# Patient Record
Sex: Female | Born: 1965
Health system: Southern US, Community
[De-identification: ages and names within clinical notes are randomized; demographics above are authoritative.]

## PROBLEM LIST (undated history)

## (undated) DIAGNOSIS — K219 Gastro-esophageal reflux disease without esophagitis: Secondary | ICD-10-CM

---

## 2012-12-25 ENCOUNTER — Emergency Department (HOSPITAL_BASED_OUTPATIENT_CLINIC_OR_DEPARTMENT_OTHER): Payer: Worker's Compensation

## 2012-12-25 ENCOUNTER — Encounter (HOSPITAL_BASED_OUTPATIENT_CLINIC_OR_DEPARTMENT_OTHER): Payer: Self-pay

## 2012-12-25 ENCOUNTER — Emergency Department (HOSPITAL_BASED_OUTPATIENT_CLINIC_OR_DEPARTMENT_OTHER)
Admission: EM | Admit: 2012-12-25 | Discharge: 2012-12-25 | Disposition: A | Discharge status: Home / Self Care | Payer: Worker's Compensation | Attending: Emergency Medicine | Admitting: Emergency Medicine

## 2012-12-25 DIAGNOSIS — S60229A Contusion of unspecified hand, initial encounter: Secondary | ICD-10-CM | POA: Insufficient documentation

## 2012-12-25 DIAGNOSIS — S60221A Contusion of right hand, initial encounter: Secondary | ICD-10-CM

## 2012-12-25 DIAGNOSIS — W230XXA Caught, crushed, jammed, or pinched between moving objects, initial encounter: Secondary | ICD-10-CM | POA: Insufficient documentation

## 2012-12-25 DIAGNOSIS — Y9289 Other specified places as the place of occurrence of the external cause: Secondary | ICD-10-CM | POA: Insufficient documentation

## 2012-12-25 DIAGNOSIS — Y9389 Activity, other specified: Secondary | ICD-10-CM | POA: Insufficient documentation

## 2012-12-25 DIAGNOSIS — S60211A Contusion of right wrist, initial encounter: Secondary | ICD-10-CM

## 2012-12-25 DIAGNOSIS — S60219A Contusion of unspecified wrist, initial encounter: Secondary | ICD-10-CM | POA: Insufficient documentation

## 2012-12-25 DIAGNOSIS — Y990 Civilian activity done for income or pay: Secondary | ICD-10-CM | POA: Insufficient documentation

## 2012-12-25 MED ORDER — TRAMADOL HCL 50 MG PO TABS: 50.0000 mg | ORAL_TABLET | Freq: Four times a day (QID) | ORAL | Status: DC | PRN

## 2012-12-25 NOTE — ED Provider Notes (Addendum)
CSN: [id number]     Arrival date & time 12/25/12  1013 History     First MD Initiated Contact with Patient 12/25/12 1041     Chief Complaint  Patient presents with  . Right Wrist Injury    (Consider location/radiation/quality/duration/timing/severity/associated sxs/prior Treatment) Patient is a 47 y.o. female presenting with wrist pain. The history is provided by the patient.  Wrist Pain This is a new (was at work and her rag got caught in a conveyor belt and pulled her arm in) problem. The current episode started 3 to 5 hours ago. The problem occurs constantly. The problem has not changed since onset.Associated symptoms comments: Swelling and pain of the right wrist and hand with small cuts. The symptoms are aggravated by bending (palpation). The symptoms are relieved by rest and ice. She has tried rest and a cold compress for the symptoms. The treatment provided mild relief.    History reviewed. No pertinent past medical history. History reviewed. No pertinent past surgical history. No family history on file. History  Substance Use Topics  . Smoking status: Never Smoker   . Smokeless tobacco: Not on file  . Alcohol Use: No   OB History   Grav Para Term Preterm Abortions TAB SAB Ect Mult Living                 Review of Systems  Neurological: Negative for weakness and numbness.  All other systems reviewed and are negative.    Allergies  Review of patient's allergies indicates no known allergies.  Home Medications  No current outpatient prescriptions on file. BP 124/81  Pulse 58  Temp(Src) 98.1 F (36.7 C) (Oral)  Resp 16  Ht 5\' 5"  (1.651 m)  Wt 150 lb (68.04 kg)  BMI 24.96 kg/m2  SpO2 100%  LMP 12/07/2012 Physical Exam  Nursing note and vitals reviewed. Constitutional: She is oriented to person, place, and time. She appears well-developed and well-nourished. No distress.  HENT:  Head: Normocephalic and atraumatic.  Eyes: EOM are normal. Pupils are equal,  round, and reactive to light.  Cardiovascular: Normal rate.   Pulmonary/Chest: Effort normal.  Musculoskeletal:       Right wrist: She exhibits tenderness and swelling. She exhibits normal range of motion and no deformity.       Arms: Mild superficial abrasion of the dorsal wrist and hand.  Swelling and ecchymosis of right wrist and hand.  Normal <2sec cap refill and normal sensation  Neurological: She is alert and oriented to person, place, and time.  Psychiatric: She has a normal mood and affect. Her behavior is normal.    ED Course   Procedures (including critical care time)  Labs Reviewed - No data to display Dg Wrist Complete Right  12/25/2012   *RADIOLOGY REPORT*  Clinical Data: Pain post trauma  RIGHT WRIST - COMPLETE 3+ VIEW  Comparison: None.  Findings:  Frontal, oblique, lateral, and ulnar deviation scaphoid images were obtained.  There is no fracture or dislocation.  Joint spaces appear intact.  No erosive change.  IMPRESSION: No abnormality noted.   Original Report Authenticated By: Bretta Bang, M.D.   Dg Hand Complete Right  12/25/2012   *RADIOLOGY REPORT*  Clinical Data: Pain post trauma  RIGHT HAND - COMPLETE 3+ VIEW  Comparison: None.  Findings: Frontal, oblique, and lateral views were obtained. There is no fracture or dislocation.  There is mild osteoarthritic change in the fourth DIP joint.  No erosive change.  No radiopaque foreign body.  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  No apparent fracture or dislocation.   Original Report Authenticated By: Bretta Bang, M.D.   1. Contusion of wrist, right, initial encounter   2. Contusion of hand, right, initial encounter     MDM   Patient whose hand was caught in a conveyor belt today and presents due to swelling, tenderness of the hand and wrist.  Neurovascularly intact with 2+ radial pulse.  Plain films negative for acute injury and tetanus shot up-to-date. Patient placed in a Velcro wrist  splint and discharged home to ice and elevate  Gwyneth Sprout, MD 12/25/12 1103  Gwyneth Sprout, MD 12/25/12 1104

## 2012-12-25 NOTE — ED Notes (Signed)
Pt back from x-ray.

## 2012-12-25 NOTE — ED Notes (Signed)
Patient transported to X-ray ambulatory with tech. 

## 2012-12-25 NOTE — ED Notes (Signed)
Pt injured her right wrist, right wrist is now swollen. ROM intact, sensation intact, motor intact. Good capillary refill. Pulse present. Pain 8/10. Pt alert, oriented. Thoughts coherent. ABC intact.

## 2012-12-25 NOTE — ED Notes (Signed)
MD at bedside. 

## 2013-07-18 ENCOUNTER — Encounter: Payer: Self-pay | Admitting: Physician Assistant

## 2013-07-18 ENCOUNTER — Ambulatory Visit (INDEPENDENT_AMBULATORY_CARE_PROVIDER_SITE_OTHER): Payer: Self-pay | Admitting: Physician Assistant

## 2013-07-18 VITALS — BP 106/78 | HR 74 | Temp 98.8°F | Resp 16 | Ht 64.0 in | Wt 165.8 lb

## 2013-07-18 DIAGNOSIS — M25531 Pain in right wrist: Secondary | ICD-10-CM

## 2013-07-18 DIAGNOSIS — M25539 Pain in unspecified wrist: Secondary | ICD-10-CM

## 2013-07-18 DIAGNOSIS — G5600 Carpal tunnel syndrome, unspecified upper limb: Secondary | ICD-10-CM

## 2013-07-18 NOTE — Patient Instructions (Signed)
Please go downstairs for imaging.  I will call you with your results.  You will be contacted by a Sports Medicine doctor for further evaluation and treatment.  In the meantime, take Aleve for pain.  Apply ice to wrist.  Elevate the wrist.  Attempt to continue wearing wrist splint, even at night.  Sndrome del tnel carpiano  (Carpal Tunnel Syndrome)  El tnel carpiano es un espacio estrecho ubicado en el lado palmar de la [place]. Est formado por los huesos de la [name] y los ligamentos. Los nervios, vasos sanguneos y tendones pasan a travs del tnel carpiano. Los movimientos de la [name] o ciertas enfermedades pueden causar hinchazn del tnel. Esta hinchazn comprime el nervio principal en la mueca ((nervio mediano) y ocasiona un trastorno doloroso que se denomina sndrome del tnel carpiano. CAUSAS   Movimientos repetidos de [name] mueca.  Lesiones en la [name].  Ciertas enfermedades como la artritis, la diabetes, el alcoholismo, el hipertiroidismo o la insuficiencia renal.  Obesidad.  Embarazo. SNTOMAS   Sensacin de "pinchazos" en los dedos o la mano.  Hormigueo o entumecimiento en los dedos o en la mano.  Sensacin dolorosa en todo el brazo.  Dolor en la mueca que sube por el brazo hasta el hombro.  Dolor que baja por la mano o los dedos.  Sensacin de [place] Oildebilidad en las manos. DIAGNSTICO  El mdico le har una historia clnica y un examen fsico. Puede ser necesario hacer un electromiograma. Esta prueba mide las seales elctricas enviadas por los msculos. Generalmente el paso de las seales elctricas es impedido por el sndrome del tnel carpiano. Tambin puede ser necesario que le tomen radiografas.  TRATAMIENTO  El sndrome del tnel carpiano puede curarse espontneamente sin tratamiento. El [name] le indicar el uso de un cabestrillo para la [name] o que tome medicamentos como antiinflamatorios no esteroides. Las inyecciones de cortisona pueden ayudar. A veces es  necesaria la ciruga para liberar el nervio comprimido.  INSTRUCCIONES PARA EL CUIDADO EN EL HOGAR   Tome todos los medicamentos segn le indic su mdico. Slo tome medicamentos de venta libre o recetados para [name] el dolor, las molestias o bajar la fiebre segn las indicaciones de su mdico.  Si le aconsejaron usar un cabestrillo para evitar que la [name] se doble, selo como le indicaron. Es importante que use el cabestrillo durante la noche. selo mientras sienta dolor o adormecimiento en la mano, el brazo o la [name]. Esto puede durar entre 1 y 2 meses.  Haga reposar la [name] de toda actividad que le cause dolor. Si sus sntomas estn relacionados con [name] trabajo, deber conversar con su empleador acerca de la posibilidad de cambiar a una tarea que no requiera el uso de la [name].  Aplique hielo en la mueca despus de los perodos prolongados de Winfieldactividad.  Ponga el hielo en una bolsa plstica.  Colquese una toalla entre la piel y la bolsa de hielo.  Deje el hielo en el lugar durante 15 a 20 minutos, 3 a 4 veces por da.  Cumpla con todas las visitas de control, segn le indique su mdico. Aqu se incluyen derivaciones a un ortopedista, fisioterapia y rehabilitacin. [name] demora en obtener la asistencia necesaria puede dar como resultado una demora o fracaso en la curacin. SOLICITE ATENCIN MDICA DE INMEDIATO SI:   Desarrolla nuevos e inexplicables sntomas.  Los sntomas actuales empeoran y la medicacin no los [place]a. ASEGRESE DE QUE:   Comprende estas instrucciones.  Controlar su enfermedad.  Solicitar Jacobs Engineeringayuda  de inmediato si no mejora o si empeora. Document Released: 05/02/2005 Document Revised: 01/25/2012 [facility] Patient Information 2014 [place], [place].

## 2013-07-18 NOTE — Progress Notes (Signed)
Patient presents to clinic today to establish care.  Acute Concerns: Complains of pain of right wrist with mild decrease ROM.  Denies recent injury or heavy lifting.  Endorses injry of R wrist several months ago at work. Did not have any pain or symptoms after incident.  Patient endorses numbness and tingling of R I/II phalanges.  Has been wearing a wrist splint with some relief of symptoms.  Denies redness or warmth.  Denies hx of RA, OA or gout.  Chronic Issues: Denies significant PMH.  Health Maintenance: Dental -- overdue Vision -- overdue Immunizations -- Declines flu shot.  Tetanus Unknown.  Mammogram -- overdue.  PAP -- overdue.  History reviewed. No pertinent past medical history.  Past Surgical History  Procedure Laterality Date  . Cesarean section      No current outpatient prescriptions on file prior to visit.   No current facility-administered medications on file prior to visit.    No Known Allergies  Family History  Problem Relation Age of Onset  . Hypertension Mother     History   Social History  . Marital Status: Married    Spouse Name: N/A    Number of Children: N/A  . Years of Education: N/A   Occupational History  . Not on file.   Social History Main Topics  . Smoking status: Former Games developermoker  . Smokeless tobacco: Not on file  . Alcohol Use: Yes     Comment: rarely  . Drug Use: No  . Sexual Activity: Not on file   Other Topics Concern  . Not on file   Social History Narrative  . No narrative on file   Review of Systems  Constitutional: Negative for fever and malaise/fatigue.  HENT: Negative for ear discharge, ear pain, hearing loss and tinnitus.   Eyes: Negative for blurred vision, double vision and pain.  Respiratory: Negative for cough and shortness of breath.   Cardiovascular: Negative for chest pain and palpitations.  Gastrointestinal: Negative.   Genitourinary: Negative.   Musculoskeletal: Positive for joint pain.  Neurological:  Positive for tingling and sensory change. Negative for dizziness, speech change, focal weakness, seizures, loss of consciousness and headaches.  Endo/Heme/Allergies: Negative for environmental allergies.  Psychiatric/Behavioral: Negative for depression, suicidal ideas, hallucinations and substance abuse. The patient is not nervous/anxious and does not have insomnia.     BP 106/78  Pulse 74  Temp(Src) 98.8 F (37.1 C) (Oral)  Resp 16  Ht 5\' 4"  (1.626 m)  Wt 165 lb 12 oz (75.184 kg)  BMI 28.44 kg/m2  SpO2 98%  LMP 04/19/2013  Physical Exam  Vitals reviewed. Constitutional: She is oriented to person, place, and time and well-developed, well-nourished, and in no distress.  HENT:  Head: Normocephalic and atraumatic.  Right Ear: External ear normal.  Left Ear: External ear normal.  Eyes: Pupils are equal, round, and reactive to light.  Neck: Neck supple.  Cardiovascular: Normal rate, regular rhythm, normal heart sounds and intact distal pulses.   Pulmonary/Chest: Effort normal and breath sounds normal.  Musculoskeletal:       Right elbow: Normal.      Right wrist: She exhibits tenderness. She exhibits normal range of motion, no bony tenderness, no swelling, no effusion, no crepitus, no deformity and no laceration.  Neurological: She is alert and oriented to person, place, and time. A sensory deficit is present.  Altered tactile sensation to light touch of Right I/II phalanges. + Tinel and Phalen sign.  Skin: Skin is warm and dry.  No rash noted.  Psychiatric: Affect normal.    No results found for this or any previous visit (from the past 2160 hour(s)).  Assessment/Plan: CTS (carpal tunnel syndrome) Will obtain DG right wrist.  Wrist splints.  Alternate tylenol and Ibuprofen.  Avoid twisting motion of arms. Referral to Sports medicine for persistent CTS despite conservative measures.

## 2013-07-19 ENCOUNTER — Ambulatory Visit: Payer: Self-pay | Admitting: Family Medicine

## 2013-07-22 ENCOUNTER — Ambulatory Visit: Payer: Self-pay | Admitting: Family Medicine

## 2013-07-28 DIAGNOSIS — G5600 Carpal tunnel syndrome, unspecified upper limb: Secondary | ICD-10-CM | POA: Insufficient documentation

## 2013-07-28 NOTE — Assessment & Plan Note (Signed)
Will obtain DG right wrist.  Wrist splints.  Alternate tylenol and Ibuprofen.  Avoid twisting motion of arms. Referral to Sports medicine for persistent CTS despite conservative measures.

## 2015-06-02 ENCOUNTER — Other Ambulatory Visit: Payer: Self-pay

## 2015-06-02 ENCOUNTER — Encounter (HOSPITAL_BASED_OUTPATIENT_CLINIC_OR_DEPARTMENT_OTHER): Payer: Self-pay | Admitting: *Deleted

## 2015-06-02 ENCOUNTER — Emergency Department (HOSPITAL_BASED_OUTPATIENT_CLINIC_OR_DEPARTMENT_OTHER)
Admission: EM | Admit: 2015-06-02 | Discharge: 2015-06-02 | Disposition: A | Discharge status: Home / Self Care | Payer: 59 | Attending: Emergency Medicine | Admitting: Emergency Medicine

## 2015-06-02 ENCOUNTER — Emergency Department (HOSPITAL_BASED_OUTPATIENT_CLINIC_OR_DEPARTMENT_OTHER): Payer: 59

## 2015-06-02 DIAGNOSIS — K828 Other specified diseases of gallbladder: Secondary | ICD-10-CM | POA: Insufficient documentation

## 2015-06-02 DIAGNOSIS — K8050 Calculus of bile duct without cholangitis or cholecystitis without obstruction: Secondary | ICD-10-CM

## 2015-06-02 DIAGNOSIS — K8020 Calculus of gallbladder without cholecystitis without obstruction: Secondary | ICD-10-CM | POA: Insufficient documentation

## 2015-06-02 DIAGNOSIS — R1011 Right upper quadrant pain: Secondary | ICD-10-CM

## 2015-06-02 DIAGNOSIS — Z87891 Personal history of nicotine dependence: Secondary | ICD-10-CM | POA: Diagnosis not present

## 2015-06-02 LAB — CBC WITH DIFFERENTIAL/PLATELET
Basophils Absolute: 0 10*3/uL (ref 0.0–0.1)
Basophils Relative: 0 %
EOS ABS: 0.1 10*3/uL (ref 0.0–0.7)
EOS PCT: 1 %
HCT: 42.8 % (ref 36.0–46.0)
Hemoglobin: 14.1 g/dL (ref 12.0–15.0)
LYMPHS ABS: 3 10*3/uL (ref 0.7–4.0)
LYMPHS PCT: 39 %
MCH: 29.1 pg (ref 26.0–34.0)
MCHC: 32.9 g/dL (ref 30.0–36.0)
MCV: 88.4 fL (ref 78.0–100.0)
MONO ABS: 0.5 10*3/uL (ref 0.1–1.0)
Monocytes Relative: 6 %
Neutro Abs: 4.1 10*3/uL (ref 1.7–7.7)
Neutrophils Relative %: 54 %
PLATELETS: 336 10*3/uL (ref 150–400)
RBC: 4.84 MIL/uL (ref 3.87–5.11)
RDW: 13.5 % (ref 11.5–15.5)
WBC: 7.7 10*3/uL (ref 4.0–10.5)

## 2015-06-02 LAB — COMPREHENSIVE METABOLIC PANEL
ALT: 32 U/L (ref 14–54)
ANION GAP: 9 (ref 5–15)
AST: 22 U/L (ref 15–41)
Albumin: 4.1 g/dL (ref 3.5–5.0)
Alkaline Phosphatase: 98 U/L (ref 38–126)
BUN: 11 mg/dL (ref 6–20)
CHLORIDE: 105 mmol/L (ref 101–111)
CO2: 26 mmol/L (ref 22–32)
Calcium: 9.4 mg/dL (ref 8.9–10.3)
Creatinine, Ser: 0.7 mg/dL (ref 0.44–1.00)
Glucose, Bld: 117 mg/dL — ABNORMAL HIGH (ref 65–99)
POTASSIUM: 3.9 mmol/L (ref 3.5–5.1)
SODIUM: 140 mmol/L (ref 135–145)
Total Bilirubin: 0.4 mg/dL (ref 0.3–1.2)
Total Protein: 7.9 g/dL (ref 6.5–8.1)

## 2015-06-02 LAB — URINALYSIS, ROUTINE W REFLEX MICROSCOPIC
BILIRUBIN URINE: NEGATIVE
Glucose, UA: NEGATIVE mg/dL
Ketones, ur: NEGATIVE mg/dL
Leukocytes, UA: NEGATIVE
NITRITE: NEGATIVE
PROTEIN: NEGATIVE mg/dL
SPECIFIC GRAVITY, URINE: 1.009 (ref 1.005–1.030)
pH: 6.5 (ref 5.0–8.0)

## 2015-06-02 LAB — LIPASE, BLOOD: LIPASE: 23 U/L (ref 11–51)

## 2015-06-02 LAB — URINE MICROSCOPIC-ADD ON: WBC UA: NONE SEEN WBC/hpf (ref 0–5)

## 2015-06-02 MED ORDER — MORPHINE SULFATE (PF) 4 MG/ML IV SOLN
4.0000 mg | Freq: Once | INTRAVENOUS | Status: AC
  Administered 2015-06-02: 4 mg via INTRAVENOUS
  Filled 2015-06-02: qty 1

## 2015-06-02 MED ORDER — ONDANSETRON HCL 4 MG PO TABS: 4.0000 mg | ORAL_TABLET | Freq: Four times a day (QID) | ORAL | Status: DC

## 2015-06-02 MED ORDER — HYDROCODONE-ACETAMINOPHEN 5-325 MG PO TABS: 1.0000 | ORAL_TABLET | ORAL | Status: DC | PRN

## 2015-06-02 MED ORDER — ONDANSETRON HCL 4 MG/2ML IJ SOLN
4.0000 mg | Freq: Once | INTRAMUSCULAR | Status: AC
  Administered 2015-06-02: 4 mg via INTRAVENOUS
  Filled 2015-06-02: qty 2

## 2015-06-02 MED ORDER — SODIUM CHLORIDE 0.9 % IV SOLN
Freq: Once | INTRAVENOUS | Status: AC
  Administered 2015-06-02: 16:00:00 via INTRAVENOUS

## 2015-06-02 MED ORDER — OMEPRAZOLE 20 MG PO CPDR: 20.0000 mg | DELAYED_RELEASE_CAPSULE | Freq: Every day | ORAL | Status: DC

## 2015-06-02 NOTE — ED Provider Notes (Signed)
CSN: [id number]     Arrival date & time 06/02/15  1512 History   First MD Initiated Contact with Patient 06/02/15 1541     Chief Complaint  Patient presents with  . Abdominal Pain     (Consider location/radiation/quality/duration/timing/severity/associated sxs/prior Treatment) HPI Comments: Patient speaks only [place]. She complains of pain in her right upper abdomen that radiates to her mid back onset 3 days ago. It is constant with eating. Nausea without vomiting. Chills and subjective fever. Denies diarrhea, chest pain, shortness of breath, urinary or vaginal symptoms. She's never had this pain before. History of C-section. No heart or lung history. She has a poor appetite does not want to eat or drink due to the pain.  The history is provided by the patient. The history is limited by a language barrier. A language interpreter was used.    History reviewed. No pertinent past medical history. Past Surgical History  Procedure Laterality Date  . Cesarean section     Family History  Problem Relation Age of Onset  . Hypertension Mother    Social History  Substance Use Topics  . Smoking status: Former Games developer  . Smokeless tobacco: None  . Alcohol Use: Yes     Comment: rarely   OB History    No data available     Review of Systems  Constitutional: Positive for activity change and appetite change. Negative for fever and fatigue.  HENT: Negative for congestion and rhinorrhea.   Respiratory: Negative for cough, chest tightness and shortness of breath.   Cardiovascular: Negative for chest pain.  Gastrointestinal: Positive for nausea and abdominal pain. Negative for vomiting and diarrhea.  Genitourinary: Negative for dysuria, hematuria, vaginal bleeding and vaginal discharge.  Musculoskeletal: Negative for myalgias and arthralgias.  Skin: Negative for rash.  Neurological: Negative for dizziness, weakness and headaches.  A complete 10 system review of systems was obtained and all  systems are negative except as noted in the HPI and PMH.      Allergies  Review of patient's allergies indicates no known allergies.  Home Medications   Prior to Admission medications   Medication Sig Start Date End Date Taking? Authorizing Provider  acetaminophen (TYLENOL) 500 MG tablet Take 500 mg by mouth every 6 (six) hours as needed.    Historical Provider, MD  HYDROcodone-acetaminophen (NORCO/VICODIN) 5-325 MG tablet Take 1 tablet by mouth every 4 (four) hours as needed. 06/02/15   Glynn Octave, MD  omeprazole (PRILOSEC) 20 MG capsule Take 1 capsule (20 mg total) by mouth daily. 06/02/15   Glynn Octave, MD  ondansetron (ZOFRAN) 4 MG tablet Take 1 tablet (4 mg total) by mouth every 6 (six) hours. 06/02/15   Glynn Octave, MD   BP 123/69 mmHg  Pulse 74  Temp(Src) 98.1 F (36.7 C) (Oral)  Resp 16  Ht  (1.651 m)  Wt 164 lb (74.39 kg)  BMI 27.29 kg/m2  SpO2 100% Physical Exam  Constitutional: She is oriented to person, place, and time. She appears well-developed and well-nourished. No distress.  HENT:  Head: Normocephalic and atraumatic.  Mouth/Throat: Oropharynx is clear and moist. No oropharyngeal exudate.  Eyes: Conjunctivae and EOM are normal. Pupils are equal, round, and reactive to light.  Neck: Normal range of motion. Neck supple.  No meningismus.  Cardiovascular: Normal rate, regular rhythm, normal heart sounds and intact distal pulses.   No murmur heard. Pulmonary/Chest: Effort normal and breath sounds normal. No respiratory distress.  Abdominal: Soft. There is tenderness. There is guarding.  There is no rebound.  RUQ tenderness with guarding  Musculoskeletal: Normal range of motion. She exhibits no edema or tenderness.  No CVAT, no rash  Neurological: She is alert and oriented to person, place, and time. No cranial nerve deficit. She exhibits normal muscle tone. Coordination normal.  No ataxia on finger to nose bilaterally. No pronator drift. 5/5 strength  throughout. CN 2-12 intact.Equal grip strength. Sensation intact.   Skin: Skin is warm.  Psychiatric: She has a normal mood and affect. Her behavior is normal.  Nursing note and vitals reviewed.   ED Course  Procedures (including critical care time) Labs Review Labs Reviewed  URINALYSIS, ROUTINE W REFLEX MICROSCOPIC (NOT AT [facility]) - Abnormal; Notable for the following:    Hgb urine dipstick TRACE (*)    All other components within normal limits  URINE MICROSCOPIC-ADD ON - Abnormal; Notable for the following:    Squamous Epithelial / LPF 0-5 (*)    Bacteria, UA RARE (*)    All other components within normal limits  COMPREHENSIVE METABOLIC PANEL - Abnormal; Notable for the following:    Glucose, Bld 117 (*)    All other components within normal limits  CBC WITH DIFFERENTIAL/PLATELET  LIPASE, BLOOD    Imaging Review US Abdomen Limited Ruq  06/02/2015  CLINICAL DATA:  Right upper quadrant pain, postprandial for 4 days EXAM: US ABDOMEN LIMITED - RIGHT UPPER QUADRANT COMPARISON:  None. FINDINGS: Gallbladder: Layering sludge within the gallbladder. No visible lysed stones. No wall thickening. Negative sonographic Murphy's. Common bile duct: Diameter: Normal caliber, 1 mm. Liver: Increased echotexture throughout the liver compatible with fatty infiltration. No focal abnormality or biliary ductal dilatation. IMPRESSION: Fatty liver. Layering sludge within the gallbladder. Electronically Signed   By: Charlett Nose M.D.   On: 06/02/2015 17:34   I have personally reviewed and evaluated these images and lab results as part of my medical decision-making.   EKG Interpretation None      MDM   Final diagnoses:  RUQ pain  Sludge in gallbladder  Biliary colic  RUQ with nausea, concern for gallbladder pathology. No Chest pain or SOB. No cough. No fever.   WBC normal. LFTs and lipase normal.   ultrasound shows sludge in gallbladder without any stones. No gallbladder wall thickening or  pericholecystic fluid.  Suspect biliary colic as the source of patient's symptoms. No evidence of cholecystitis at this time. Advised to avoid alcohol, caffeine, spicy foods, NSAIDs, fatty foods. We'll treat pain and symptoms. Follow-up with surgery. Return precautions discussed.   ED ECG REPORT   Date: 06/02/2015  Rate: 67  Rhythm: normal sinus rhythm  QRS Axis: normal  Intervals: normal  ST/T Wave abnormalities: nonspecific ST/T changes  Conduction Disutrbances:none  Narrative Interpretation:   Old EKG Reviewed: none available  I have personally reviewed the EKG tracing and agree with the computerized printout as noted.   Glynn Octave, MD 06/02/15 [phone]

## 2015-06-02 NOTE — ED Notes (Signed)
Pt's daughter Tanda Rockers assisting with translating of d/c instructions. Verbalizes understanding to f/u with PCP and [facility]. Ambulatory at with steady gait. Rx x 3 given for zofran, hydrocodone, and prilosec

## 2015-06-02 NOTE — ED Notes (Signed)
Pain from the right side of her back around to her abdomen x 3 days. Nausea. Fever.

## 2015-06-02 NOTE — ED Notes (Signed)
Patient transported to Ultrasound 

## 2015-06-02 NOTE — ED Notes (Signed)
MD at bedside. 

## 2015-06-02 NOTE — Discharge Instructions (Signed)
Clico biliar Follow-up with the surgeon. Avoid alcohol, NSAIDs, spicy foods, fatty foods, caffeine. Return to the ED with worsening pain, vomiting or fever or any other concerns. (Biliary Colic) El clico biliar es un dolor en la regin superior del abdomen. El dolor:  Generalmente, se siente en la regin superior derecha del abdomen, pero tambin puede aparecer en la regin central, justo debajo del esternn.  Puede irradiarse a la espalda, hacia el omplato derecho.  Puede ser constante o discontinuo.  Puede estar acompaado de nuseas y vmitos. La mayor parte del tiempo, desaparece en el trmino de 1 a 5horas. Una vez que el dolor intenso desaparece, puede haber dolor abdominal leve que se prolonga durante aproximadamente 24horas. La causa del clico biliar es una obstruccin en las vas biliares. Las vas biliares son conductos que transportan bilis, un lquido que ayuda a Location manager las [place], desde la vescula biliar hasta el intestino delgado. Generalmente, el clico biliar ocurre despus de comer, cuando el sistema digestivo necesita bilis. El dolor aparece cuando las clulas musculares se contraen bruscamente para intentar mover la obstruccin, para que la bilis pueda pasar. INSTRUCCIONES PARA EL CUIDADO EN EL HOGAR  Tome los medicamentos solamente como se lo haya indicado el mdico.  Beba suficiente lquido para Pharmacologist la orina clara o de color amarillo plido.  Evite los alimentos grasos y fritos. Este tipo de alimentos aumentan la demanda de bilis del organismo.  Evite los alimentos que Dispensing optician.  No coma en exceso.  No ingiera una comida abundante despus de ayunar. SOLICITE ATENCIN MDICA SI:  Lance Muss.  El dolor [place].  Vomita.  Tiene nuseas que le impiden la ingesta de comidas y bebidas. SOLICITE ATENCIN MDICA DE INMEDIATO SI:  Maxie Barb repentina, tiene Grant Ruts y escalofros.  Observa una coloracin amarillenta (ictericia) de:  La  piel.  Las partes blancas de los ojos.  Las [employer].  Siente dolor continuo o intenso que no se alivia con los medicamentos.  Tiene nuseas y vmitos que no se alivian con los [employer].  Tiene mareos o se desmaya.   Esta informacin no tiene Theme park manager el consejo del mdico. Asegrese de hacerle al mdico cualquier pregunta que tenga.   Document Released: 08/09/2007 Document Revised: 09/16/2014 Elsevier Interactive Patient Education [employer]

## 2015-06-15 ENCOUNTER — Ambulatory Visit: Payer: Self-pay | Admitting: General Surgery

## 2015-06-15 NOTE — H&P (Signed)
History of Present Illness Anna Filler MD; 06/15/2015 9:30 AM) The patient is a 50 year old female who presents for evaluation of gall stones. The patient is a 50 year old Spanish-speaking female who comes in after being referred by [facility] for evaluation of biliary colic. The patient presented ER secondary to right upper quadrant pain that radiates to the right costal margin/back area. Patient states that she had associated nausea and vomiting. States that this was her first episode. She states that the pain started after eating an orange. She states that she has not had any further occurrences.  Patient's only had a history of a C-section.   Other Problems Fay Records, CMA; 06/15/2015 9:04 AM) No pertinent past medical history  Past Surgical History Fay Records, CMA; 06/15/2015 9:04 AM) Cesarean Section - 1  Diagnostic Studies History Fay Records, CMA; 06/15/2015 9:04 AM) Colonoscopy never Mammogram 1-3 years ago Pap Smear 1-5 years ago  Allergies Fay Records, CMA; 06/15/2015 9:05 AM) No Known Drug Allergies01/30/2017  Medication History Fay Records, CMA; 06/15/2015 9:05 AM) Omeprazole (  Capsule DR, Oral) Active. Ondansetron HCl (  Tablet, Oral) Active. Hydrocodone-Acetaminophen (5-325MG  Tablet, Oral) Active. Medications Reconciled  Social History Fay Records, [place]; 06/15/2015 9:04 AM) Caffeine use Tea. No alcohol use Tobacco use Never smoker.  Family History Fay Records, [place]; 06/15/2015 9:04 AM) Hypertension Mother.  Pregnancy / Birth History Fay Records, CMA; 06/15/2015 9:04 AM) Age at menarche 12 years. Age of menopause 31-50 Gravida 4 Maternal age 54-20 Para 4    Review of Systems Fay Records CMA; 06/15/2015 9:04 AM) General Present- Appetite Loss, Fatigue and Weight Gain. Not Present- Chills, Fever, Night Sweats and Weight Loss. Skin Not Present- Change in Wart/Mole, Dryness, Hives, Jaundice, New Lesions, Non-Healing Wounds,  Rash and Ulcer. HEENT Present- Sore Throat. Not Present- Earache, Hearing Loss, Hoarseness, Nose Bleed, Oral Ulcers, Ringing in the Ears, Seasonal Allergies, Sinus Pain, Visual Disturbances, Wears glasses/contact lenses and Yellow Eyes. Respiratory Present- Snoring. Not Present- Bloody sputum, Chronic Cough, Difficulty Breathing and Wheezing. Breast Not Present- Breast Mass, Breast Pain, Nipple Discharge and Skin Changes. Cardiovascular Not Present- Chest Pain, Difficulty Breathing Lying Down, Leg Cramps, Palpitations, Rapid Heart Rate, Shortness of Breath and Swelling of Extremities. Gastrointestinal Present- Abdominal Pain, Bloating, Gets full quickly at meals and Nausea. Not Present- Bloody Stool, Change in Bowel Habits, Chronic diarrhea, Constipation, Difficulty Swallowing, Excessive gas, Hemorrhoids, Indigestion, Rectal Pain and Vomiting. Female Genitourinary Not Present- Frequency, Nocturia, Painful Urination, Pelvic Pain and Urgency. Musculoskeletal Present- Back Pain. Not Present- Joint Pain, Joint Stiffness, Muscle Pain, Muscle Weakness and Swelling of Extremities. Neurological Not Present- Decreased Memory, Fainting, Headaches, Numbness, Seizures, Tingling, Tremor, Trouble walking and Weakness. Psychiatric Not Present- Anxiety, Bipolar, Change in Sleep Pattern, Depression, Fearful and Frequent crying. Endocrine Not Present- Cold Intolerance, Excessive Hunger, Hair Changes, Heat Intolerance, Hot flashes and New Diabetes. Hematology Not Present- Easy Bruising, Excessive bleeding, Gland problems, HIV and Persistent Infections.  Vitals Fay Records CMA; 06/15/2015 9:05 AM) 06/15/2015 9:05 AM Weight: 167 lb Height: 66in Body Surface Area: 1.85 m Body Mass Index: 26.95 kg/m  Temp.: 97.20F(Temporal)  Pulse: 82 (Regular)  BP: 124/72 (Sitting, Left Arm, Standard)       Physical Exam Anna Filler, MD; 06/15/2015 9:30 AM) General Mental Status-Alert. General  Appearance-Consistent with stated age. Hydration-Well hydrated. Voice-Normal.  Head and Neck Head-normocephalic, atraumatic with no lesions or palpable masses.  Eye Eyeball - Bilateral-Extraocular movements intact. Sclera/Conjunctiva - Bilateral-No scleral icterus.  Chest and Lung Exam Chest and lung exam  reveals -quiet, even and easy respiratory effort with no use of accessory muscles. Inspection Chest Wall - Normal. Back - normal.  Cardiovascular Cardiovascular examination reveals -normal heart sounds, regular rate and rhythm with no murmurs.  Abdomen Inspection Normal Exam - No Hernias. Palpation/Percussion Normal exam - Soft, Non Tender, No Rebound tenderness, No Rigidity (guarding) and No hepatosplenomegaly. Auscultation Normal exam - Bowel sounds normal.  Neurologic Neurologic evaluation reveals -alert and oriented x 3 with no impairment of recent or remote memory. Mental Status-Normal.  Musculoskeletal Normal Exam - Left-Upper Extremity Strength Normal and Lower Extremity Strength Normal. Normal Exam - Right-Upper Extremity Strength Normal, Lower Extremity Weakness.    Assessment & Plan Anna Filler MD; 06/15/2015 9:30 AM) SYMPTOMATIC CHOLELITHIASIS (K80.20) Impression: 50 year old female with likely biliary colic versus symptomatic cholelithiasis  1. The patient will like to proceed to the operative for laparoscopic cholecystectomy 2. Risks and benefits were discussed with the patient to generally include, but not limited to: infection, bleeding, possible need for post op ERCP, damage to the bile ducts, bile leak, and possible need for further surgery. Alternatives were offered and described. All questions were answered and the patient voiced understanding of the procedure and wishes to proceed at this point with a laparoscopic cholecystectomy

## 2015-07-28 ENCOUNTER — Ambulatory Visit: Admit: 2015-07-28 | Payer: Self-pay | Admitting: General Surgery

## 2015-07-28 SURGERY — LAPAROSCOPIC CHOLECYSTECTOMY
Anesthesia: General

## 2016-02-22 ENCOUNTER — Ambulatory Visit: Payer: Self-pay | Admitting: General Surgery

## 2016-02-24 ENCOUNTER — Inpatient Hospital Stay (HOSPITAL_COMMUNITY)
Admission: RE | Admit: 2016-02-24 | Discharge: 2016-02-24 | Disposition: A | Discharge status: Home / Self Care | Payer: Self-pay | Source: Ambulatory Visit

## 2016-02-24 NOTE — Pre-Procedure Instructions (Signed)
Anna GourdLeticia S Peters  02/24/2016      [id number] - [place], [place] - [address] [place] [id number] Phone: [phone] Fax: [phone]    Your procedure is scheduled on 02/29/16.  [facility] to [facility] Admitting at 830 A.M.  Call this number if you have problems the morning of surgery:  [phone]   Remember:  Do not eat food or drink liquids after midnight.  Take these medicines the morning of surgery with A SIP OF WATER omeprazole,pain med and zofran if needed  STOP all herbel meds, nsaids (aleve,naproxen,advil,ibuprofen)   Starting TODAY including aspirin, vitamins   Do not wear jewelry, make-up or nail polish.  Do not wear lotions, powders, or perfumes, or deoderant.  Do not shave 48 hours prior to surgery.  Men may shave face and neck.  Do not bring valuables to the hospital.  [facility] is not responsible for any belongings or valuables.  Contacts, dentures or bridgework may not be worn into surgery.  Leave your suitcase in the car.  After surgery it may be brought to your room.  For patients admitted to the hospital, discharge time will be determined by your treatment team.  Patients discharged the day of surgery will not be allowed to drive home.   Name and phone number of your driver:    Special instructions:   Special Instructions: [place] - Preparing for Surgery  Before surgery, you can play an important role.  Because skin is not sterile, your skin needs to be as free of germs as possible.  You can reduce the number of germs on you skin by washing with CHG (chlorahexidine gluconate) soap before surgery.  CHG is an antiseptic cleaner which kills germs and bonds with the skin to continue killing germs even after washing.  Please DO NOT use if you have an allergy to CHG or antibacterial soaps.  If your skin becomes reddened/irritated stop using the CHG and inform your nurse when  you arrive at Short Stay.  Do not shave (including legs and underarms) for at least 48 hours prior to the first CHG shower.  You may shave your face.  Please follow these instructions carefully:   1.  Shower with CHG Soap the night before surgery and the morning of Surgery.  2.  If you choose to wash your hair, wash your hair first as usual with your normal shampoo.  3.  After you shampoo, rinse your hair and body thoroughly to remove the Shampoo.  4.  Use CHG as you would any other liquid soap.  You can apply chg directly  to the skin and wash gently with scrungie or a clean washcloth.  5.  Apply the CHG Soap to your body ONLY FROM THE NECK DOWN.  Do not use on open wounds or open sores.  Avoid contact with your eyes ears, mouth and genitals (private parts).  Wash genitals (private parts)       with your normal soap.  6.  Wash thoroughly, paying special attention to the area where your surgery will be performed.  7.  Thoroughly rinse your body with warm water from the neck down.  8.  DO NOT shower/wash with your normal soap after using and rinsing off the CHG Soap.  9.  Pat yourself dry with a clean towel.            10.  Wear clean  pajamas.            11.  Place clean sheets on your bed the night of your first shower and do not sleep with pets.  Day of Surgery  Do not apply any lotions/deodorants the morning of surgery.  Please wear clean clothes to the hospital/surgery center.  Please read over the fact sheets that you were given.

## 2016-02-26 ENCOUNTER — Encounter (HOSPITAL_COMMUNITY): Payer: Self-pay | Admitting: *Deleted

## 2016-02-28 NOTE — H&P (Signed)
History of Present Illness  The patient is a 50 year old female who presents for evaluation of gall stones. The patient is a 50 year old Spanish-speaking female who comes in after being [facility] for [facility] of biliary colic. The patient presented ER secondary to right upper quadrant pain that radiates to the right costal margin/back area. Patient states that she had associated nausea and vomiting. States that this was her first episode. She states that the pain started after eating an orange. She states that she has not had any further occurrences.  Patient's only had a history of a C-section.   Other Problems No pertinent past medical history  Past Surgical History Cesarean Section - 1  Diagnostic Studies History Colonoscopy never Mammogram 1-3 years ago Pap Smear 1-5 years ago  Allergies ( No Known Drug Allergies01/30/2017  Medication History Omeprazole (20MG  Capsule DR, Oral) Active. Ondansetron HCl (4MG  Tablet, Oral) Active. Hydrocodone-Acetaminophen (5-325MG  Tablet, Oral) Active. Medications Reconciled  Social History  Caffeine use Tea. No alcohol use Tobacco use Never smoker.  Family History  Hypertension Mother.  Pregnancy / Birth History  Age at menarche 12 years. Age of menopause 896-50 Gravida 4 Maternal age 51-20 Para 4    Review of Systems  General Present- Appetite Loss, Fatigue and Weight Gain. Not Present- Chills, Fever, Night Sweats and Weight Loss. Skin Not Present- Change in Wart/Mole, Dryness, Hives, Jaundice, New Lesions, Non-Healing Wounds, Rash and Ulcer. HEENT Present- Sore Throat. Not Present- Earache, Hearing Loss, Hoarseness, Nose Bleed, Oral Ulcers, Ringing in the Ears, Seasonal Allergies, Sinus Pain, Visual Disturbances, Wears glasses/contact lenses and Yellow Eyes. Respiratory Present- Snoring. Not Present- Bloody sputum, Chronic Cough, Difficulty Breathing and Wheezing. Breast Not  Present- Breast Mass, Breast Pain, Nipple Discharge and Skin Changes. Cardiovascular Not Present- Chest Pain, Difficulty Breathing Lying Down, Leg Cramps, Palpitations, Rapid Heart Rate, Shortness of Breath and Swelling of Extremities. Gastrointestinal Present- Abdominal Pain, Bloating, Gets full quickly at meals and Nausea. Not Present- Bloody Stool, Change in Bowel Habits, Chronic diarrhea, Constipation, Difficulty Swallowing, Excessive gas, Hemorrhoids, Indigestion, Rectal Pain and Vomiting. Female Genitourinary Not Present- Frequency, Nocturia, Painful Urination, Pelvic Pain and Urgency. Musculoskeletal Present- Back Pain. Not Present- Joint Pain, Joint Stiffness, Muscle Pain, Muscle Weakness and Swelling of Extremities. Neurological Not Present- Decreased Memory, Fainting, Headaches, Numbness, Seizures, Tingling, Tremor, Trouble walking and Weakness. Psychiatric Not Present- Anxiety, Bipolar, Change in Sleep Pattern, Depression, Fearful and Frequent crying. Endocrine Not Present- Cold Intolerance, Excessive Hunger, Hair Changes, Heat Intolerance, Hot flashes and New Diabetes. Hematology Not Present- Easy Bruising, Excessive bleeding, Gland problems, HIV and Persistent Infections.   Physical Exam  General Mental Status-Alert. General Appearance-Consistent with stated age. Hydration-Well hydrated. Voice-Normal.  Head and Neck Head-normocephalic, atraumatic with no lesions or palpable masses.  Eye Eyeball - Bilateral-Extraocular movements intact. Sclera/Conjunctiva - Bilateral-No scleral icterus.  Chest and Lung Exam Chest and lung exam reveals -quiet, even and easy respiratory effort with no use of accessory muscles. Inspection Chest Wall - Normal. Back - normal.  Cardiovascular Cardiovascular examination reveals -normal heart sounds, regular rate and rhythm with no murmurs.  Abdomen Inspection Normal Exam - No Hernias. Palpation/Percussion Normal exam  - Soft, Non Tender, No Rebound tenderness, No Rigidity (guarding) and No hepatosplenomegaly. Auscultation Normal exam - Bowel sounds normal.  Neurologic Neurologic evaluation reveals -alert and oriented x 3 with no impairment of recent or remote memory. Mental Status-Normal.  Musculoskeletal Normal Exam - Left-Upper Extremity Strength Normal and Lower Extremity Strength Normal. Normal Exam -  Right-Upper Extremity Strength Normal, Lower Extremity Weakness.    Assessment & Plan SYMPTOMATIC CHOLELITHIASIS (K80.20) Impression: 50 year old female with likely biliary colic versus symptomatic cholelithiasis  1. The patient will like to proceed to the operative for laparoscopic cholecystectomy 2. Risks and benefits were discussed with the patient to generally include, but not limited to: infection, bleeding, possible need for post op ERCP, damage to the bile ducts, bile leak, and possible need for further surgery. Alternatives were offered and described. All questions were answered and the patient voiced understanding of the procedure and wishes to proceed at this point with a laparoscopic cholecystectomy

## 2016-02-29 ENCOUNTER — Ambulatory Visit (HOSPITAL_COMMUNITY)
Admission: RE | Admit: 2016-02-29 | Discharge: 2016-02-29 | Disposition: A | Discharge status: Home / Self Care | Payer: BLUE CROSS/BLUE SHIELD | Source: Ambulatory Visit | Attending: General Surgery | Admitting: General Surgery

## 2016-02-29 ENCOUNTER — Ambulatory Visit (HOSPITAL_COMMUNITY): Payer: BLUE CROSS/BLUE SHIELD | Admitting: Anesthesiology

## 2016-02-29 ENCOUNTER — Encounter (HOSPITAL_COMMUNITY)
Admission: RE | Disposition: A | Discharge status: Home / Self Care | Payer: Self-pay | Source: Ambulatory Visit | Attending: General Surgery

## 2016-02-29 ENCOUNTER — Encounter (HOSPITAL_COMMUNITY): Payer: Self-pay | Admitting: *Deleted

## 2016-02-29 DIAGNOSIS — Z87891 Personal history of nicotine dependence: Secondary | ICD-10-CM | POA: Insufficient documentation

## 2016-02-29 DIAGNOSIS — K811 Chronic cholecystitis: Secondary | ICD-10-CM | POA: Diagnosis not present

## 2016-02-29 DIAGNOSIS — K219 Gastro-esophageal reflux disease without esophagitis: Secondary | ICD-10-CM | POA: Diagnosis not present

## 2016-02-29 DIAGNOSIS — K8050 Calculus of bile duct without cholangitis or cholecystitis without obstruction: Secondary | ICD-10-CM | POA: Diagnosis present

## 2016-02-29 HISTORY — PX: CHOLECYSTECTOMY: SHX55

## 2016-02-29 HISTORY — DX: Gastro-esophageal reflux disease without esophagitis: K21.9

## 2016-02-29 LAB — CBC
HEMATOCRIT: 41.8 % (ref 36.0–46.0)
HEMOGLOBIN: 13.4 g/dL (ref 12.0–15.0)
MCH: 28.5 pg (ref 26.0–34.0)
MCHC: 32.1 g/dL (ref 30.0–36.0)
MCV: 88.7 fL (ref 78.0–100.0)
Platelets: 291 10*3/uL (ref 150–400)
RBC: 4.71 MIL/uL (ref 3.87–5.11)
RDW: 13.4 % (ref 11.5–15.5)
WBC: 5.9 10*3/uL (ref 4.0–10.5)

## 2016-02-29 SURGERY — LAPAROSCOPIC CHOLECYSTECTOMY
Anesthesia: General | Site: Abdomen

## 2016-02-29 MED ORDER — 0.9 % SODIUM CHLORIDE (POUR BTL) OPTIME
TOPICAL | Status: DC | PRN
  Administered 2016-02-29: 1000 mL

## 2016-02-29 MED ORDER — KETOROLAC TROMETHAMINE 30 MG/ML IJ SOLN
INTRAMUSCULAR | Status: DC
Start: 2016-02-29 — End: 2016-02-29
  Filled 2016-02-29: qty 1

## 2016-02-29 MED ORDER — HYDROCODONE-ACETAMINOPHEN 7.5-325 MG PO TABS: 1.0000 | ORAL_TABLET | Freq: Once | ORAL | Status: DC | PRN

## 2016-02-29 MED ORDER — MORPHINE SULFATE (PF) 2 MG/ML IV SOLN: 2.0000 mg | INTRAVENOUS | Status: DC | PRN

## 2016-02-29 MED ORDER — ONDANSETRON HCL 4 MG/2ML IJ SOLN
INTRAMUSCULAR | Status: DC | PRN
Start: 2016-02-29 — End: 2016-02-29
  Administered 2016-02-29: 4 mg via INTRAVENOUS

## 2016-02-29 MED ORDER — ACETAMINOPHEN 325 MG PO TABS: 650.0000 mg | ORAL_TABLET | ORAL | Status: DC | PRN

## 2016-02-29 MED ORDER — NEOSTIGMINE METHYLSULFATE 10 MG/10ML IV SOLN
INTRAVENOUS | Status: DC | PRN
  Administered 2016-02-29: 5 mg via INTRAVENOUS

## 2016-02-29 MED ORDER — PROMETHAZINE HCL 25 MG/ML IJ SOLN: 6.2500 mg | INTRAMUSCULAR | Status: DC | PRN

## 2016-02-29 MED ORDER — MIDAZOLAM HCL 5 MG/5ML IJ SOLN
INTRAMUSCULAR | Status: DC | PRN
  Administered 2016-02-29: 2 mg via INTRAVENOUS

## 2016-02-29 MED ORDER — DEXMEDETOMIDINE HCL IN NACL 200 MCG/50ML IV SOLN
INTRAVENOUS | Status: AC
  Filled 2016-02-29: qty 50

## 2016-02-29 MED ORDER — PROPOFOL 10 MG/ML IV BOLUS
INTRAVENOUS | Status: AC
  Filled 2016-02-29: qty 20

## 2016-02-29 MED ORDER — MIDAZOLAM HCL 2 MG/2ML IJ SOLN
INTRAMUSCULAR | Status: AC
  Filled 2016-02-29: qty 2

## 2016-02-29 MED ORDER — PROPOFOL 10 MG/ML IV BOLUS
INTRAVENOUS | Status: DC | PRN
  Administered 2016-02-29: 150 mg via INTRAVENOUS

## 2016-02-29 MED ORDER — OXYCODONE-ACETAMINOPHEN 5-325 MG PO TABS: 1.0000 | ORAL_TABLET | ORAL | 0 refills | Status: AC | PRN

## 2016-02-29 MED ORDER — SODIUM CHLORIDE 0.9% FLUSH: 3.0000 mL | INTRAVENOUS | Status: DC | PRN

## 2016-02-29 MED ORDER — CEFAZOLIN SODIUM-DEXTROSE 2-4 GM/100ML-% IV SOLN
2.0000 g | INTRAVENOUS | Status: AC
  Administered 2016-02-29: 2 g via INTRAVENOUS
  Filled 2016-02-29: qty 100

## 2016-02-29 MED ORDER — OXYCODONE HCL 5 MG PO TABS: 5.0000 mg | ORAL_TABLET | ORAL | Status: DC | PRN

## 2016-02-29 MED ORDER — HYDROMORPHONE HCL 1 MG/ML IJ SOLN
INTRAMUSCULAR | Status: AC
  Filled 2016-02-29: qty 1

## 2016-02-29 MED ORDER — GLYCOPYRROLATE 0.2 MG/ML IV SOSY
PREFILLED_SYRINGE | INTRAVENOUS | Status: AC
  Filled 2016-02-29: qty 3

## 2016-02-29 MED ORDER — CHLORHEXIDINE GLUCONATE CLOTH 2 % EX PADS
6.0000 | MEDICATED_PAD | Freq: Once | CUTANEOUS | Status: DC
Start: 2016-02-29 — End: 2016-02-29

## 2016-02-29 MED ORDER — ACETAMINOPHEN 650 MG RE SUPP: 650.0000 mg | RECTAL | Status: DC | PRN

## 2016-02-29 MED ORDER — NEOSTIGMINE METHYLSULFATE 5 MG/5ML IV SOSY
PREFILLED_SYRINGE | INTRAVENOUS | Status: AC
Start: 2016-02-29 — End: 2016-02-29
  Filled 2016-02-29: qty 5

## 2016-02-29 MED ORDER — LIDOCAINE 2% (20 MG/ML) 5 ML SYRINGE
INTRAMUSCULAR | Status: AC
Start: 2016-02-29 — End: 2016-02-29
  Filled 2016-02-29: qty 5

## 2016-02-29 MED ORDER — FENTANYL CITRATE (PF) 100 MCG/2ML IJ SOLN
INTRAMUSCULAR | Status: AC
  Filled 2016-02-29: qty 4

## 2016-02-29 MED ORDER — GLYCOPYRROLATE 0.2 MG/ML IJ SOLN
INTRAMUSCULAR | Status: DC | PRN
  Administered 2016-02-29: .6 mg via INTRAVENOUS

## 2016-02-29 MED ORDER — LACTATED RINGERS IV SOLN
INTRAVENOUS | Status: DC
  Administered 2016-02-29: 10:00:00 via INTRAVENOUS

## 2016-02-29 MED ORDER — HYDROMORPHONE HCL 1 MG/ML IJ SOLN
0.2500 mg | INTRAMUSCULAR | Status: DC | PRN
  Administered 2016-02-29 (×4): 0.5 mg via INTRAVENOUS

## 2016-02-29 MED ORDER — LIDOCAINE HCL (CARDIAC) 20 MG/ML IV SOLN
INTRAVENOUS | Status: DC | PRN
  Administered 2016-02-29: 60 mg via INTRATRACHEAL

## 2016-02-29 MED ORDER — CHLORHEXIDINE GLUCONATE CLOTH 2 % EX PADS: 6.0000 | MEDICATED_PAD | Freq: Once | CUTANEOUS | Status: DC

## 2016-02-29 MED ORDER — ROCURONIUM BROMIDE 100 MG/10ML IV SOLN
INTRAVENOUS | Status: DC | PRN
  Administered 2016-02-29: 40 mg via INTRAVENOUS
  Administered 2016-02-29: 10 mg via INTRAVENOUS

## 2016-02-29 MED ORDER — SODIUM CHLORIDE 0.9 % IV SOLN: 250.0000 mL | INTRAVENOUS | Status: DC | PRN

## 2016-02-29 MED ORDER — ARTIFICIAL TEARS OP OINT
TOPICAL_OINTMENT | OPHTHALMIC | Status: AC
  Filled 2016-02-29: qty 3.5

## 2016-02-29 MED ORDER — BUPIVACAINE HCL 0.25 % IJ SOLN
INTRAMUSCULAR | Status: DC | PRN
  Administered 2016-02-29: 7 mL

## 2016-02-29 MED ORDER — SODIUM CHLORIDE 0.9 % IR SOLN
Status: DC | PRN
  Administered 2016-02-29: 1

## 2016-02-29 MED ORDER — BUPIVACAINE HCL (PF) 0.25 % IJ SOLN
INTRAMUSCULAR | Status: AC
  Filled 2016-02-29: qty 10

## 2016-02-29 MED ORDER — FENTANYL CITRATE (PF) 100 MCG/2ML IJ SOLN
INTRAMUSCULAR | Status: AC
  Filled 2016-02-29: qty 2

## 2016-02-29 MED ORDER — ONDANSETRON HCL 4 MG/2ML IJ SOLN
INTRAMUSCULAR | Status: AC
  Filled 2016-02-29: qty 2

## 2016-02-29 MED ORDER — FENTANYL CITRATE (PF) 250 MCG/5ML IJ SOLN
INTRAMUSCULAR | Status: DC | PRN
  Administered 2016-02-29 (×2): 50 ug via INTRAVENOUS
  Administered 2016-02-29: 100 ug via INTRAVENOUS

## 2016-02-29 MED ORDER — SODIUM CHLORIDE 0.9% FLUSH: 3.0000 mL | Freq: Two times a day (BID) | INTRAVENOUS | Status: DC

## 2016-02-29 MED ORDER — ROCURONIUM BROMIDE 10 MG/ML (PF) SYRINGE
PREFILLED_SYRINGE | INTRAVENOUS | Status: AC
Start: 2016-02-29 — End: 2016-02-29
  Filled 2016-02-29: qty 10

## 2016-02-29 MED ORDER — KETOROLAC TROMETHAMINE 30 MG/ML IJ SOLN
30.0000 mg | Freq: Once | INTRAMUSCULAR | Status: AC | PRN
  Administered 2016-02-29: 30 mg via INTRAVENOUS

## 2016-02-29 MED ORDER — BUPIVACAINE HCL (PF) 0.25 % IJ SOLN
INTRAMUSCULAR | Status: AC
  Filled 2016-02-29: qty 30

## 2016-02-29 SURGICAL SUPPLY — 36 items
BENZOIN TINCTURE PRP APPL 2/3 (GAUZE/BANDAGES/DRESSINGS) ×3 IMPLANT
CANISTER SUCTION 2500CC (MISCELLANEOUS) ×3 IMPLANT
CHLORAPREP W/TINT 26ML (MISCELLANEOUS) ×3 IMPLANT
CLIP LIGATING HEMO O LOK GREEN (MISCELLANEOUS) ×3 IMPLANT
CLOSURE WOUND 1/2 X4 (GAUZE/BANDAGES/DRESSINGS) ×1
COVER SURGICAL LIGHT HANDLE (MISCELLANEOUS) ×3 IMPLANT
COVER TRANSDUCER ULTRASND (DRAPES) ×3 IMPLANT
DEVICE TROCAR PUNCTURE CLOSURE (ENDOMECHANICALS) ×3 IMPLANT
ELECT REM PT RETURN 9FT ADLT (ELECTROSURGICAL) ×3
ELECTRODE REM PT RTRN 9FT ADLT (ELECTROSURGICAL) ×1 IMPLANT
GAUZE SPONGE 2X2 8PLY STRL LF (GAUZE/BANDAGES/DRESSINGS) ×1 IMPLANT
GLOVE BIO SURGEON STRL SZ7.5 (GLOVE) ×3 IMPLANT
GOWN STRL REUS W/ TWL LRG LVL3 (GOWN DISPOSABLE) ×2 IMPLANT
GOWN STRL REUS W/ TWL XL LVL3 (GOWN DISPOSABLE) ×1 IMPLANT
GOWN STRL REUS W/TWL LRG LVL3 (GOWN DISPOSABLE) ×4
GOWN STRL REUS W/TWL XL LVL3 (GOWN DISPOSABLE) ×2
KIT BASIN OR (CUSTOM PROCEDURE TRAY) ×3 IMPLANT
KIT ROOM TURNOVER OR (KITS) ×3 IMPLANT
NEEDLE INSUFFLATION 14GA 120MM (NEEDLE) ×3 IMPLANT
NS IRRIG 1000ML POUR BTL (IV SOLUTION) ×3 IMPLANT
PAD ARMBOARD 7.5X6 YLW CONV (MISCELLANEOUS) ×6 IMPLANT
POUCH RETRIEVAL ECOSAC 10 (ENDOMECHANICALS) IMPLANT
POUCH RETRIEVAL ECOSAC 10MM (ENDOMECHANICALS)
SCISSORS LAP 5X35 DISP (ENDOMECHANICALS) ×3 IMPLANT
SET IRRIG TUBING LAPAROSCOPIC (IRRIGATION / IRRIGATOR) ×3 IMPLANT
SLEEVE ENDOPATH XCEL 5M (ENDOMECHANICALS) ×3 IMPLANT
SPECIMEN JAR SMALL (MISCELLANEOUS) ×3 IMPLANT
SPONGE GAUZE 2X2 STER 10/PKG (GAUZE/BANDAGES/DRESSINGS) ×2
STRIP CLOSURE SKIN 1/2X4 (GAUZE/BANDAGES/DRESSINGS) ×2 IMPLANT
SUT MNCRL AB 3-0 PS2 18 (SUTURE) ×3 IMPLANT
TOWEL OR 17X24 6PK STRL BLUE (TOWEL DISPOSABLE) ×3 IMPLANT
TOWEL OR 17X26 10 PK STRL BLUE (TOWEL DISPOSABLE) ×3 IMPLANT
TRAY LAPAROSCOPIC MC (CUSTOM PROCEDURE TRAY) ×3 IMPLANT
TROCAR XCEL NON-BLD 11X100MML (ENDOMECHANICALS) ×3 IMPLANT
TROCAR XCEL NON-BLD 5MMX100MML (ENDOMECHANICALS) ×3 IMPLANT
TUBING INSUFFLATION (TUBING) ×3 IMPLANT

## 2016-02-29 NOTE — Anesthesia Procedure Notes (Signed)
Procedure Name: Intubation Date/Time: 02/29/2016 9:56 AM Performed by: Marena ChancyBECKNER, Mariamawit Depaoli S Pre-anesthesia Checklist: Patient identified, Emergency Drugs available, Suction available and Patient being monitored Patient Re-evaluated:Patient Re-evaluated prior to inductionOxygen Delivery Method: Circle System Utilized Preoxygenation: Pre-oxygenation with 100% oxygen Intubation Type: IV induction Ventilation: Mask ventilation without difficulty Laryngoscope Size: Miller and 2 Grade View: Grade III Tube type: Oral Tube size: 7.0 mm Number of attempts: 1 Airway Equipment and Method: Stylet and Oral airway Placement Confirmation: ETT inserted through vocal cords under direct vision,  positive ETCO2 and breath sounds checked- equal and bilateral Tube secured with: Tape Dental Injury: Teeth and Oropharynx as per pre-operative assessment

## 2016-02-29 NOTE — Anesthesia Preprocedure Evaluation (Signed)
Anesthesia Evaluation  Patient identified by MRN, date of birth, ID band Patient awake    Reviewed: Allergy & Precautions, NPO status , Patient's Chart, lab work & pertinent test results  Airway Mallampati: II  TM Distance: >3 FB Neck ROM: Full    Dental  (+) Dental Advisory Given   Pulmonary former smoker,    breath sounds clear to auscultation       Cardiovascular negative cardio ROS   Rhythm:Regular Rate:Normal     Neuro/Psych negative neurological ROS     GI/Hepatic Neg liver ROS, GERD  ,  Endo/Other  negative endocrine ROS  Renal/GU negative Renal ROS     Musculoskeletal   Abdominal   Peds  Hematology negative hematology ROS (+)   Anesthesia Other Findings   Reproductive/Obstetrics                             Lab Results  Component Value Date   WBC 7.7 06/02/2015   HGB 14.1 06/02/2015   HCT 42.8 06/02/2015   MCV 88.4 06/02/2015   PLT 336 06/02/2015   Lab Results  Component Value Date   CREATININE 0.70 06/02/2015   BUN 11 06/02/2015   NA 140 06/02/2015   K 3.9 06/02/2015   CL 105 06/02/2015   CO2 26 06/02/2015    Anesthesia Physical Anesthesia Plan  ASA: II  Anesthesia Plan: General   Post-op Pain Management:    Induction: Intravenous  Airway Management Planned: Oral ETT  Additional Equipment:   Intra-op Plan:   Post-operative Plan: Extubation in OR  Informed Consent: I have reviewed the patients History and Physical, chart, labs and discussed the procedure including the risks, benefits and alternatives for the proposed anesthesia with the patient or authorized representative who has indicated his/her understanding and acceptance.   Dental advisory given  Plan Discussed with: CRNA  Anesthesia Plan Comments:         Anesthesia Quick Evaluation

## 2016-02-29 NOTE — Anesthesia Postprocedure Evaluation (Signed)
Anesthesia Post Note  Patient: Anna ScoreLeticia S Peters  Procedure(s) Performed: Procedure(s) (LRB): LAPAROSCOPIC CHOLECYSTECTOMY (N/A)  Patient location during evaluation: PACU Anesthesia Type: General Level of consciousness: awake and alert Pain management: pain level controlled Vital Signs Assessment: post-procedure vital signs reviewed and stable Respiratory status: spontaneous breathing, nonlabored ventilation, respiratory function stable and patient connected to nasal cannula oxygen Cardiovascular status: blood pressure returned to baseline and stable Postop Assessment: no signs of nausea or vomiting Anesthetic complications: no    Last Vitals:  Vitals:   02/29/16 1145 02/29/16 1201  BP: 110/65 109/72  Pulse:  64  Resp:  18  Temp:      Last Pain:  Vitals:   02/29/16 1201  TempSrc:   PainSc: 3                  Kennieth RadFitzgerald, Odyssey Vasbinder E

## 2016-02-29 NOTE — Interval H&P Note (Signed)
History and Physical Interval Note:  02/29/2016 9:22 AM  Anna ScoreLeticia S Peters  has presented today for surgery, with the diagnosis of Biliary colic  The various methods of treatment have been discussed with the patient and family. After consideration of risks, benefits and other options for treatment, the patient has consented to  Procedure(s): LAPAROSCOPIC CHOLECYSTECTOMY (N/A) as a surgical intervention .  The patient's history has been reviewed, patient examined, no change in status, stable for surgery.  I have reviewed the patient's chart and labs.  Questions were answered to the patient's satisfaction.     Marigene Ehlersamirez Jr., Jed LimerickArmando

## 2016-02-29 NOTE — Transfer of Care (Signed)
Immediate Anesthesia Transfer of Care Note  Patient: Anna Peters  Procedure(s) Performed: Procedure(s): LAPAROSCOPIC CHOLECYSTECTOMY (N/A)  Patient Location: PACU  Anesthesia Type:General  Level of Consciousness: awake, alert  and oriented  Airway & Oxygen Therapy: Patient Spontanous Breathing and Patient connected to nasal cannula oxygen  Post-op Assessment: Report given to RN, Post -op Vital signs reviewed and stable and Patient moving all extremities X 4  Post vital signs: Reviewed and stable  Last Vitals:  Vitals:   02/29/16 0914  BP: 127/81  Pulse: 64  Resp: 20  Temp: 36.7 C    Last Pain:  Vitals:   02/29/16 0914  TempSrc: Oral      Patients Stated Pain Goal: 5 (02/29/16 0914)  Complications: No apparent anesthesia complications

## 2016-02-29 NOTE — Discharge Instructions (Signed)
[facility] ______[facility] °LAPAROSCOPIC SURGERY: POST OP INSTRUCTIONS °Always review your discharge instruction sheet given to you by the facility where your surgery was performed. °IF YOU HAVE DISABILITY OR FAMILY LEAVE FORMS, YOU MUST BRING THEM TO THE OFFICE FOR PROCESSING.   °DO NOT GIVE THEM TO YOUR DOCTOR. ° °1. A prescription for pain medication may be given to you upon discharge.  Take your pain medication as prescribed, if needed.  If narcotic pain medicine is not needed, then you may take acetaminophen (Tylenol) or ibuprofen (Advil) as needed. °2. Take your usually prescribed medications unless otherwise directed. °3. If you need a refill on your pain medication, please contact your pharmacy.  They will contact our office to request authorization. Prescriptions will not be filled after 5pm or on week-ends. °4. You should follow a light diet the first few days after arrival home, such as soup and crackers, etc.  Be sure to include lots of fluids daily. °5. Most patients will experience some swelling and bruising in the area of the incisions.  Ice packs will help.  Swelling and bruising can take several days to resolve.  °6. It is common to experience some constipation if taking pain medication after surgery.  Increasing fluid intake and taking a stool softener (such as Colace) will usually help or prevent this problem from occurring.  A mild laxative (Milk of Magnesia or Miralax) should be taken according to package instructions if there are no bowel movements after 48 hours. °7. Unless discharge instructions indicate otherwise, you may remove your bandages 24-48 hours after surgery, and you may shower at that time.  You may have steri-strips (small skin tapes) in place directly over the incision.  These strips should be left on the skin for 7-10 days.  If your surgeon used skin glue on the incision, you may shower in 24 hours.  The glue will flake off over the next 2-3 weeks.  Any sutures or  staples will be removed at the office during your follow-up visit. °8. ACTIVITIES:  You may resume regular (light) daily activities beginning the next day--such as daily self-care, walking, climbing stairs--gradually increasing activities as tolerated.  You may have sexual intercourse when it is comfortable.  Refrain from any heavy lifting or straining until approved by your doctor. °a. You may drive when you are no longer taking prescription pain medication, you can comfortably wear a seatbelt, and you can safely maneuver your car and apply brakes. °b. RETURN TO WORK:  __________________________________________________________ °9. You should see your doctor in the office for a follow-up appointment approximately 2-3 weeks after your surgery.  Make sure that you call for this appointment within a day or two after you arrive home to insure a convenient appointment time. °10. OTHER INSTRUCTIONS: __________________________________________________________________________________________________________________________ __________________________________________________________________________________________________________________________ °WHEN TO CALL YOUR DOCTOR: °1. Fever over 101.0 °2. Inability to urinate °3. Continued bleeding from incision. °4. Increased pain, redness, or drainage from the incision. °5. Increasing abdominal pain ° °The clinic staff is available to answer your questions during regular business hours.  Please don’t hesitate to call and ask to speak to one of the nurses for clinical concerns.  If you have a medical emergency, go to the nearest emergency room or call 911.  A surgeon from [facility] is always on call at the hospital. °[address], [address], [place]  [address] ? [address] [address], [place], [place]   [address] °[phone] ? [phone] ? [phone] [phone] °Web site:   [email or web address] °

## 2016-02-29 NOTE — Op Note (Signed)
02/29/2016  10:33 AM  PATIENT:  Anna Peters  50 y.o. female  PRE-OPERATIVE DIAGNOSIS:  Biliary colic  POST-OPERATIVE DIAGNOSIS:  Biliary colic  PROCEDURE:  Procedure(s): LAPAROSCOPIC CHOLECYSTECTOMY (N/A)  SURGEON:  Surgeon(s) and Role:    * Axel FillerArmando Davonta Stroot, MD - Primary  ANESTHESIA:   local and general  EBL:  Total I/O In: -  Out: 25 [Blood:<5]  BLOOD ADMINISTERED:none  DRAINS: none   LOCAL MEDICATIONS USED:  BUPIVICAINE   SPECIMEN:  Source of Specimen:  gallbladder  DISPOSITION OF SPECIMEN:  PATHOLOGY  COUNTS:  YES  TOURNIQUET:  * No tourniquets in log *  DICTATION: .Dragon Dictation  EBL: <5cc   Complications: none   Counts: reported as correct x 2   Findings:chronically inflamed gallbladder  Indications for procedure: Pt is a 50 y/o F  with RUQ pain. It was felt pt had biliary colic.   Details of the procedure: The patient was taken to the operating and placed in the supine position with bilateral SCDs in place. A time out was called and all facts were verified. A pneumoperitoneum was obtained via A Veress needle technique to a pressure of 14mm of mercury. A 5mm trochar was then placed in the right upper quadrant under visualization, and there were no injuries to any abdominal organs. A 11 mm port was then placed in the umbilical region after infiltrating with local anesthesia under direct visualization. A second epigastric port was placed under direct visualization.   The gallbladder was identified and retracted, the peritoneum was then sharply dissected from the gallbladder and this dissection was carried down to Calot's triangle. The cystic duct was identified and dissected circumferentially and seen going into the gallbladder 360.  The cystic artery was dissected away from the surrounding tissues.   The critical angle was obtained.   2 clips were placed proximally one distally and the cystic duct transected. The cystic artery was identified and 2  clips placed proximally and one distally and transected. We then proceeded to remove the gallbladder off the hepatic fossa with Bovie cautery. A retrieval bag was then placed in the abdomen and gallbladder placed in the bag. The hepatic fossa was then reexamined and hemostasis was achieved with Bovie cautery and was excellent at this portion of the case. The subhepatic fossa and perihepatic fossa was then irrigated until the effluent was clear. The specimen bag and specimen were removed from the abdominal cavity.  The 11 mm trocar fascia was reapproximated with the Endo Close #1 Vicryl x1. The pneumoperitoneum was evacuated and all trochars removed under direct visulalization. The skin was then closed with 4-0 Monocryl and the skin dressed with Steri-Strips, gauze, and tape. The patient was awaken from general anesthesia and taken to the recovery room in stable condition.    PLAN OF CARE: Discharge to home after PACU  PATIENT DISPOSITION:  PACU - hemodynamically stable.   Delay start of Pharmacological VTE agent (>24hrs) due to surgical blood loss or risk of bleeding: not applicable

## 2016-03-01 ENCOUNTER — Encounter (HOSPITAL_COMMUNITY): Payer: Self-pay | Admitting: General Surgery

## 2017-01-13 ENCOUNTER — Emergency Department (HOSPITAL_BASED_OUTPATIENT_CLINIC_OR_DEPARTMENT_OTHER): Payer: BLUE CROSS/BLUE SHIELD

## 2017-01-13 ENCOUNTER — Encounter (HOSPITAL_BASED_OUTPATIENT_CLINIC_OR_DEPARTMENT_OTHER): Payer: Self-pay | Admitting: *Deleted

## 2017-01-13 ENCOUNTER — Emergency Department (HOSPITAL_BASED_OUTPATIENT_CLINIC_OR_DEPARTMENT_OTHER)
Admission: EM | Admit: 2017-01-13 | Discharge: 2017-01-13 | Disposition: A | Discharge status: Home / Self Care | Payer: BLUE CROSS/BLUE SHIELD | Attending: Emergency Medicine | Admitting: Emergency Medicine

## 2017-01-13 DIAGNOSIS — M5441 Lumbago with sciatica, right side: Secondary | ICD-10-CM | POA: Insufficient documentation

## 2017-01-13 DIAGNOSIS — Z87891 Personal history of nicotine dependence: Secondary | ICD-10-CM | POA: Diagnosis not present

## 2017-01-13 DIAGNOSIS — M549 Dorsalgia, unspecified: Secondary | ICD-10-CM | POA: Diagnosis present

## 2017-01-13 MED ORDER — METHOCARBAMOL 500 MG PO TABS: 750.0000 mg | ORAL_TABLET | Freq: Once | ORAL | Status: DC

## 2017-01-13 MED ORDER — KETOROLAC TROMETHAMINE 30 MG/ML IJ SOLN
30.0000 mg | Freq: Once | INTRAMUSCULAR | Status: AC
  Administered 2017-01-13: 30 mg via INTRAVENOUS
  Filled 2017-01-13: qty 1

## 2017-01-13 MED ORDER — HYDROCODONE-ACETAMINOPHEN 5-325 MG PO TABS: 1.0000 | ORAL_TABLET | Freq: Four times a day (QID) | ORAL | 0 refills | Status: AC | PRN

## 2017-01-13 MED ORDER — ONDANSETRON HCL 4 MG/2ML IJ SOLN
4.0000 mg | Freq: Once | INTRAMUSCULAR | Status: AC
Start: 2017-01-13 — End: 2017-01-13
  Administered 2017-01-13: 4 mg via INTRAVENOUS
  Filled 2017-01-13: qty 2

## 2017-01-13 MED ORDER — MORPHINE SULFATE (PF) 4 MG/ML IV SOLN
4.0000 mg | Freq: Once | INTRAVENOUS | Status: AC
  Administered 2017-01-13: 4 mg via INTRAVENOUS
  Filled 2017-01-13: qty 1

## 2017-01-13 MED ORDER — MORPHINE SULFATE (PF) 4 MG/ML IV SOLN
4.0000 mg | Freq: Once | INTRAVENOUS | Status: AC
Start: 2017-01-13 — End: 2017-01-13
  Administered 2017-01-13: 4 mg via INTRAVENOUS
  Filled 2017-01-13: qty 1

## 2017-01-13 MED ORDER — MORPHINE SULFATE (PF) 4 MG/ML IV SOLN: 6.0000 mg | Freq: Once | INTRAVENOUS | Status: DC

## 2017-01-13 MED FILL — HYDROCODON-APAP 5-325: 5-325 | 3 days supply | Qty: 15 | Fill #0

## 2017-01-13 NOTE — ED Triage Notes (Signed)
Pt understands some English; pt states she prefers her daughters to interpret rather than interpreter services via phone/iPad. Reports 2 months ago pt was dx with a pinched nerve (lower back pain radiating down R leg). Pt has upcoming appt (9/06) with specialist. Reports no imaging has been done. Denies genitourinary symptoms.

## 2017-01-13 NOTE — ED Notes (Signed)
Pt educated about not driving or performing critical tasks (such as operating heavy machinery or caring for an infant/toddler/child) while taking narcotics. Educated about not mixing narcotics with alcohol and/or other sedatives (such as muscle relaxers, Benadryl). Also educated about addicting properties of narcotics. Pt/caregiver verbalized understanding.   

## 2017-01-13 NOTE — ED Provider Notes (Signed)
[facility]   CSN: [id number] Arrival date & time: 01/13/17  [id number]     History   Chief Complaint No chief complaint on file.   HPI Anna Peters is a 51 y.o. female.  HPI  51 y.o. female presents to the Emergency Department today due to 2 months of progressively worsening back pain. Onset after lifting heavy object. Notes lower back pain radiating into right leg. Has appointment on 01-19-17 with specialist. Unsure of which kind. States that pain is 10/10 and isolated to lower back. Worse with ambulation or any movement of back. Seen by PCP and treated with steroids, muscle relaxants, motrin, with minimal relief. No fevers. No urinary symptoms. No loss of bowel or bladder function. No saddle anesthesia. No CP/SOB/ABD pain. No other symptoms noted.   No past medical history on file.  Patient Active Problem List   Diagnosis Date Noted  . CTS (carpal tunnel syndrome) 07/28/2013    Past Surgical History:  Procedure Laterality Date  . CESAREAN SECTION    . CHOLECYSTECTOMY N/A 02/29/2016   Procedure: LAPAROSCOPIC CHOLECYSTECTOMY;  Surgeon: Axel FillerArmando Ramirez, MD;  Location: [facility] OR;  Service: General;  Laterality: N/A;    OB History    No data available       Home Medications    Prior to Admission medications   Medication Sig Start Date End Date Taking? Authorizing Provider  acetaminophen (TYLENOL) 500 MG tablet Take 500 mg by mouth every 6 (six) hours as needed.    [provider]  cyclobenzaprine (FLEXERIL) 10 MG tablet Take 10 mg by mouth 3 (three) times daily as needed for muscle spasms. 01/22/16   [provider]  oxyCODONE-acetaminophen (ROXICET) 5-325 MG tablet Take 1-2 tablets by mouth every 4 (four) hours as needed. 02/29/16   Axel Filleramirez, Armando, MD    Family History Family History  Problem Relation Age of Onset  . Hypertension Mother     Social History Social History  Substance Use Topics  . Smoking status: Former Smoker      Packs/day: 0.25    Years: 2.00  . Smokeless tobacco: Never Used  . Alcohol use No     Allergies   No known allergies   Review of Systems Review of Systems ROS reviewed and all are negative for acute change except as noted in the HPI.  Physical Exam Updated Vital Signs LMP 04/19/2013   Physical Exam  Constitutional: She is oriented to person, place, and time. Vital signs are normal. She appears well-developed and well-nourished. No distress.  HENT:  Head: Normocephalic and atraumatic.  Right Ear: Hearing, tympanic membrane, external ear and ear canal normal.  Left Ear: Hearing, tympanic membrane, external ear and ear canal normal.  Nose: Nose normal.  Mouth/Throat: Uvula is midline, oropharynx is clear and moist and mucous membranes are normal. No trismus in the jaw. No oropharyngeal exudate, posterior oropharyngeal erythema or tonsillar abscesses.  Eyes: Pupils are equal, round, and reactive to light. Conjunctivae and EOM are normal.  Neck: Normal range of motion. Neck supple. No tracheal deviation present.  Cardiovascular: Normal rate, regular rhythm, S1 normal, S2 normal, normal heart sounds, intact distal pulses and normal pulses.   Pulmonary/Chest: Effort normal and breath sounds normal. No respiratory distress. She has no decreased breath sounds. She has no wheezes. She has no rhonchi. She has no rales.  Abdominal: Normal appearance and bowel sounds are normal. There is no tenderness.  Musculoskeletal: Normal range of motion.  TTP lower lumbar  around L4-L5. No swelling or palpable or visible deformities.    Neurological: She is alert and oriented to person, place, and time. She has normal strength. A sensory deficit is present.  Decreased sensation on lateral aspect of right leg compared to left. Sensation equal on medial aspect. Motor strength equal bilaterally. DTR intact bilaterally. Positive straight leg raise on right.   Skin: Skin is warm and dry.  Psychiatric: She  has a normal mood and affect. Her speech is normal and behavior is normal. Thought content normal.  Nursing note and vitals reviewed.    ED Treatments / Results  Labs (all labs ordered are listed, but only abnormal results are displayed) Labs Reviewed - No data to display  EKG  EKG Interpretation None       Radiology Dg Lumbar Spine Complete  Result Date: 01/13/2017 CLINICAL DATA:  Low back pain for 2 months. Symptoms possibly began at gym. EXAM: LUMBAR SPINE - COMPLETE 4+ VIEW COMPARISON:  None. FINDINGS: Negative for fracture or endplate erosion. Straightening of the lumbar spine without subluxation. No evidence of bone lesion. No notable facet arthropathy. Moderate disc narrowing at L5-S1 with mild endplate spurring. IMPRESSION: 1. No acute finding. 2. Moderate L5-S1 disc narrowing. Electronically Signed   By: Marnee Spring M.D.   On: 01/13/2017 11:32    Procedures Procedures (including critical care time)  Medications Ordered in ED Medications  morphine 4 MG/ML injection 4 mg (4 mg Intravenous Given 01/13/17 1106)  ondansetron (ZOFRAN) injection 4 mg (4 mg Intravenous Given 01/13/17 1103)  ketorolac (TORADOL) 30 MG/ML injection 30 mg (30 mg Intravenous Given 01/13/17 1207)  morphine 4 MG/ML injection 4 mg (4 mg Intravenous Given 01/13/17 1210)     Initial Impression / Assessment and Plan / ED Course  I have reviewed the triage vital signs and the nursing notes.  Pertinent labs & imaging results that were available during my care of the patient were reviewed by me and considered in my medical decision making (see chart for details).  Final Clinical Impressions(s) / ED Diagnoses   {I have reviewed and evaluated the relevant imaging studies.  {I have reviewed the relevant previous healthcare records.  {I obtained HPI from historian.   ED Course:  Assessment: Patient is a 51 y.o. female presents to the Emergency Department today due to 2 months of progressively worsening  back pain. Onset after lifting heavy object. Notes lower back pain radiating into right leg. Has appointment on 01-19-17 with specialist. Unsure of which kind. States that pain is 10/10 and isolated to lower back. Worse with ambulation or any movement of back. Seen by PCP and treated with steroids, muscle relaxants, motrin, with minimal relief. No fevers. No urinary symptoms. No loss of bowel or bladder function. No saddle anesthesia. No CP/SOB/ABD pain. No warning symptoms of back pain including: fecal incontinence, urinary retention or overflow incontinence, night sweats, waking from sleep with back pain, unexplained fevers or weight loss, h/o cancer, IVDU, recent trauma. No concern for cauda equina, epidural abscess, or other serious cause of back pain. I was able to ambulate thep atient myself. No gait abnormalities. Given analgesia in ED with improvement. I have reviewed the [place] Controlled Substance Reporting System. Given Rx Norco. Conservative measures such as rest, ice/heat and pain medicine indicated with PCP follow-up if no improvement with conservative management.  Disposition/Plan:  DC Home Additional Verbal discharge instructions given and discussed with patient.  Pt Instructed to f/u with PCP in the next week  for evaluation and treatment of symptoms. Return precautions given Pt acknowledges and agrees with plan  Supervising Physician Raeford Razor, MD  Final diagnoses:  Acute right-sided low back pain with right-sided sciatica    New Prescriptions New Prescriptions   No medications on file     Audry Pili, Cordelia Poche 01/13/17 1243    Raeford Razor, MD 01/18/17 [phone]

## 2017-01-13 NOTE — ED Notes (Signed)
ED Provider at bedside. 

## 2017-01-13 NOTE — ED Notes (Signed)
Pt educated about not driving or performing other critical tasks (such as operating heavy machinery, caring for infant/toddler/child) due to sedative nature of medications received in ED. Also warned about risks of consuming alcohol or taking other medications with sedative properties. Pt/caregiver verbalized understanding.  

## 2017-01-13 NOTE — ED Notes (Signed)
Patient transported to X-ray 

## 2017-01-13 NOTE — Discharge Instructions (Signed)
Please read and follow all provided instructions.  Your diagnoses today include:  1. Acute right-sided low back pain with right-sided sciatica     Tests performed today include: Vital signs - see below for your results today  Medications prescribed:   Take any prescribed medications only as directed.  Home care instructions:  Follow any educational materials contained in this packet Please rest, use ice or heat on your back for the next several days Do not lift, push, pull anything more than 10 pounds for the next week  Follow-up instructions: Please follow-up with your primary care provider in the next 1 week for further evaluation of your symptoms.   Return instructions:  SEEK IMMEDIATE MEDICAL ATTENTION IF YOU HAVE: New numbness, tingling, weakness, or problem with the use of your arms or legs Severe back pain not relieved with medications Loss control of your bowels or bladder Increasing pain in any areas of the body (such as chest or abdominal pain) Shortness of breath, dizziness, or fainting.  Worsening nausea (feeling sick to your stomach), vomiting, fever, or sweats Any other emergent concerns regarding your health   Additional Information:  Your vital signs today were: BP 122/77    Pulse 66    Temp 97.8 F (36.6 C) (Oral)    Resp 11    Ht 5\' 6"  (1.676 m)    Wt 70.3 kg (155 lb)    LMP 04/19/2013    SpO2 97%    BMI 25.02 kg/m  If your blood pressure (BP) was elevated above 135/85 this visit, please have this repeated by your doctor within one month. --------------

## 2017-01-25 ENCOUNTER — Other Ambulatory Visit: Payer: Self-pay | Admitting: Orthopedic Surgery

## 2017-01-25 DIAGNOSIS — M4726 Other spondylosis with radiculopathy, lumbar region: Secondary | ICD-10-CM

## 2017-01-31 ENCOUNTER — Ambulatory Visit
Admission: RE | Admit: 2017-01-31 | Discharge: 2017-01-31 | Disposition: A | Discharge status: Home / Self Care | Payer: BLUE CROSS/BLUE SHIELD | Source: Ambulatory Visit | Attending: Orthopedic Surgery | Admitting: Orthopedic Surgery

## 2017-01-31 DIAGNOSIS — M4726 Other spondylosis with radiculopathy, lumbar region: Secondary | ICD-10-CM

## 2018-11-11 IMAGING — MR MR LUMBAR SPINE W/O CM
5 series · 32 of 48 positions shown · non-contrast
Comparison: Prior radiographs from 01/13/2017.

CLINICAL DATA: Initial evaluation for right-sided low back pain
with right leg pain for 3 months.

EXAM:
MRI LUMBAR SPINE WITHOUT CONTRAST
TECHNIQUE: Multiplanar, multisequence MR imaging of the lumbar spine was
performed. No intravenous contrast was administered.

[Series 3: T2 · sagittal · 4.0mm · 0.49mm/px · 6 of 12 slices shown (1 of 2)]
[im 1/12]
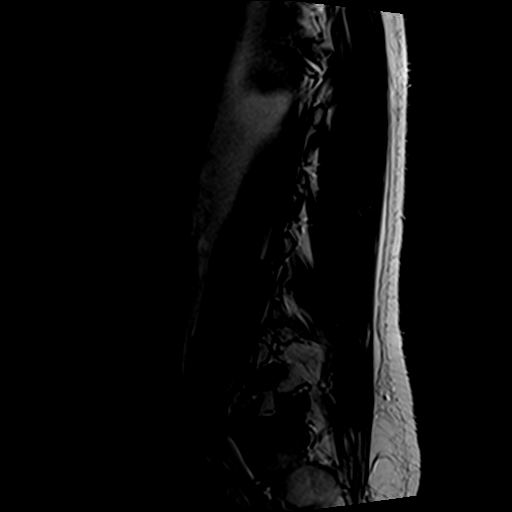
[im 3/12]
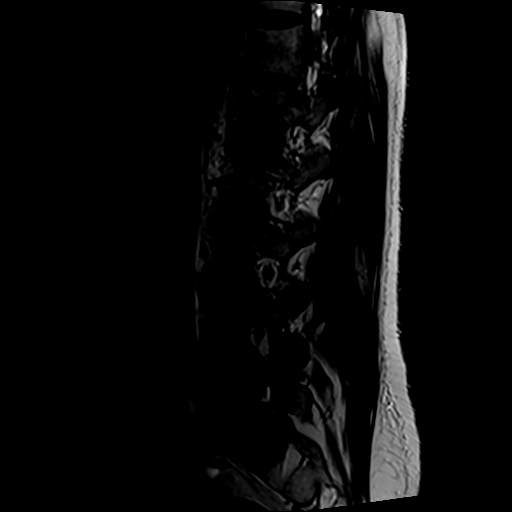
[im 5/12]
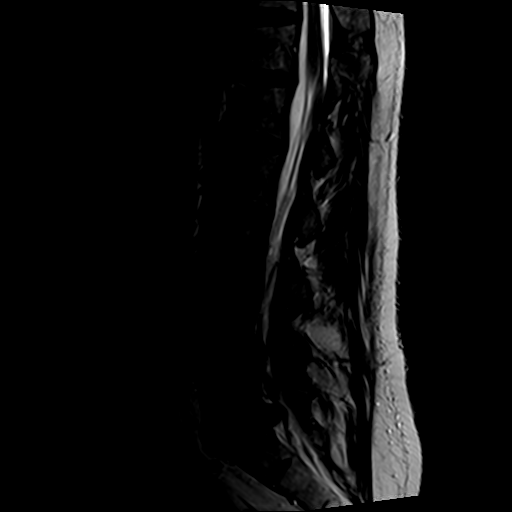
[im 7/12]
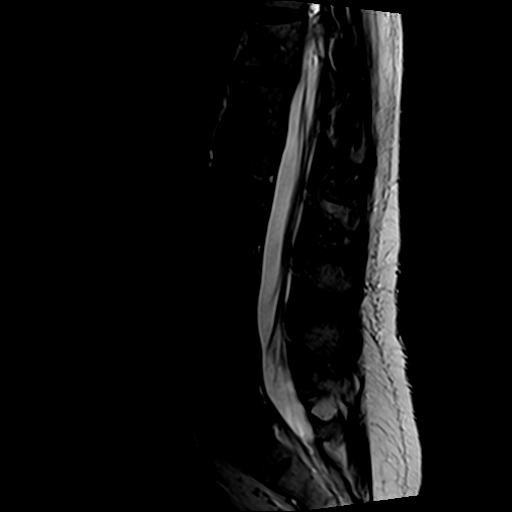
[im 9/12]
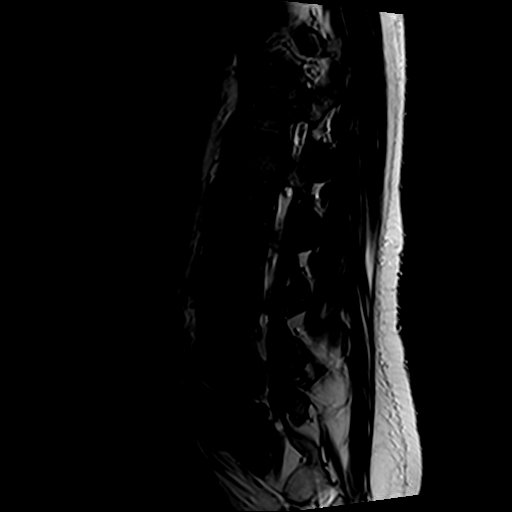
[im 12/12]
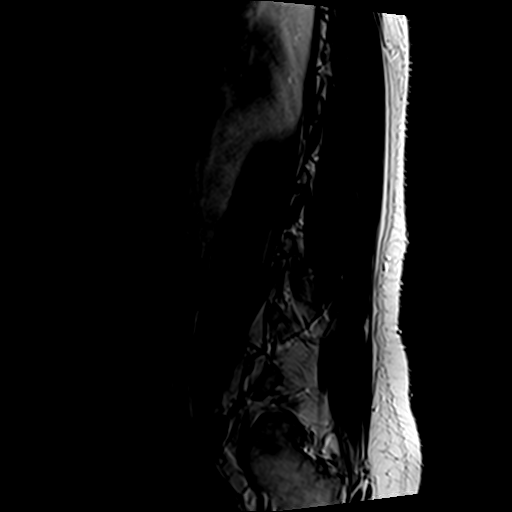

[Series 4: T1 · sagittal · 4.0mm · 0.49mm/px · 5 of 12 slices shown (1 of 2)]
[im 1/12]
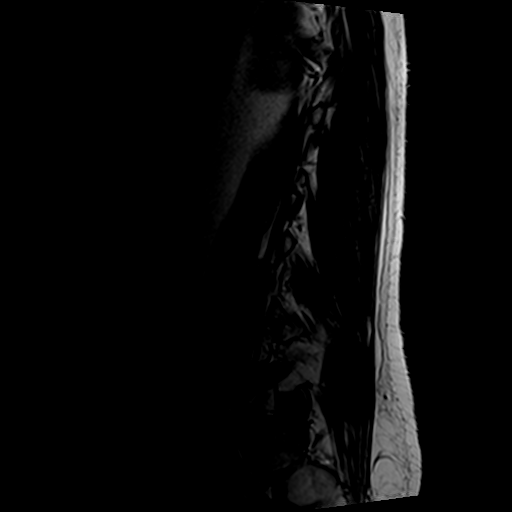
[im 3/12]
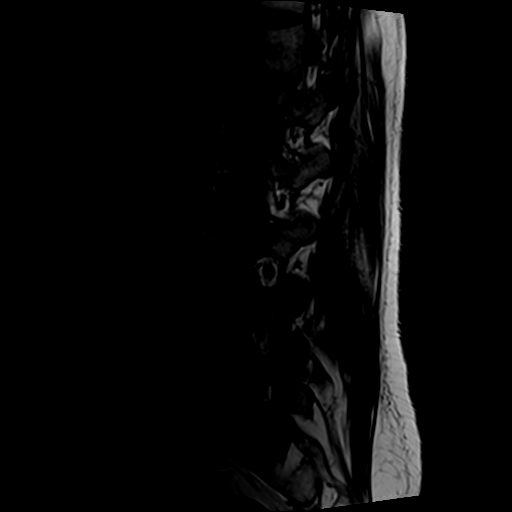
[im 6/12]
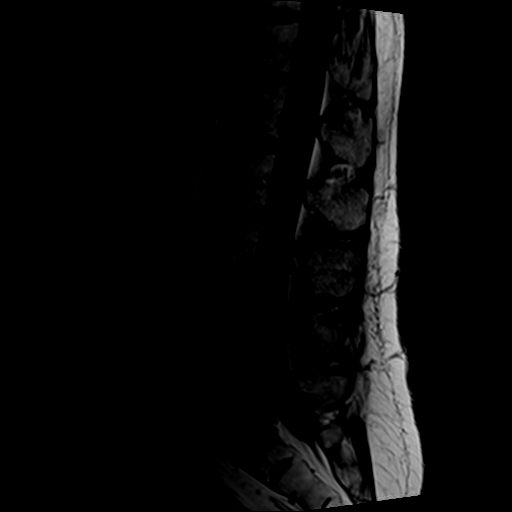
[im 9/12]
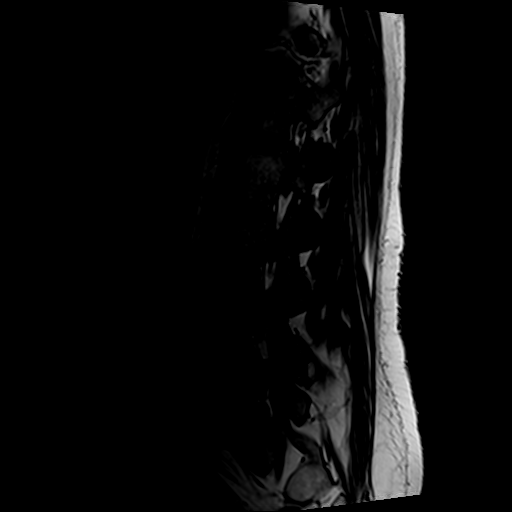
[im 12/12]
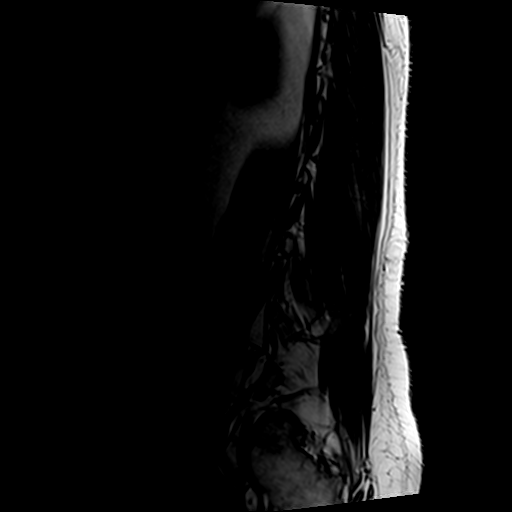

[Series 5: STIR · sagittal · 4.0mm · 0.49mm/px · 1 of 12 slices shown]
[im 1/12]
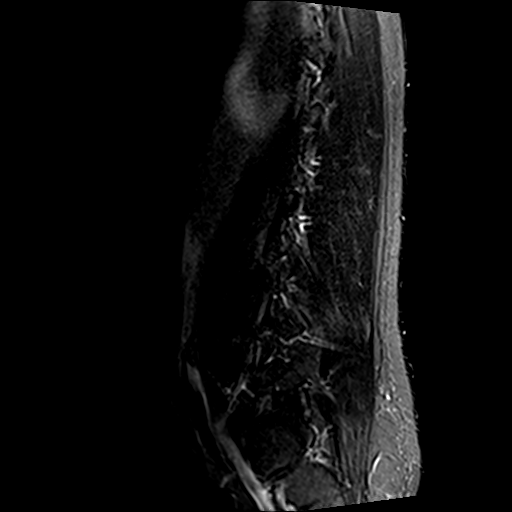

[Series 7: T2 · axial · 4.0mm · 0.74mm/px · z∈[-67,+134]mm · 10 of 37 slices shown (2 of 2)]
[im 3/37]
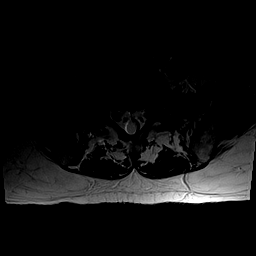
[im 5/37]
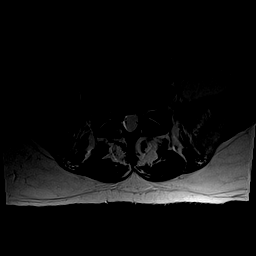
[im 8/37]
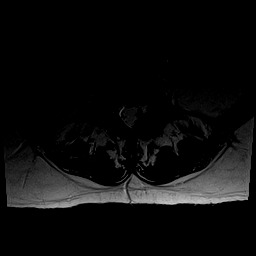
[im 13/37]
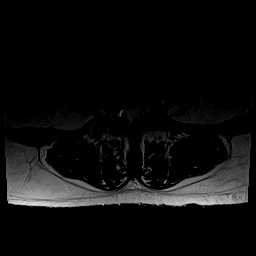
[im 17/37]
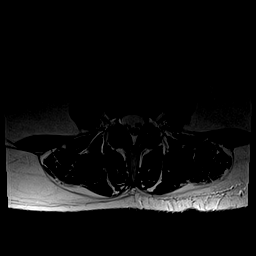
[im 20/37]
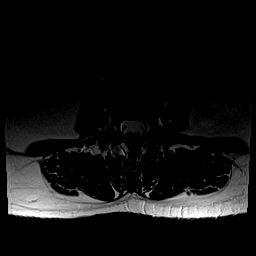
[im 22/37]
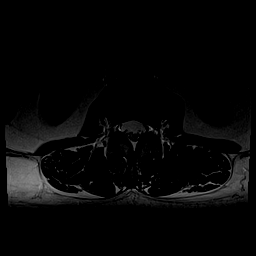
[im 27/37]
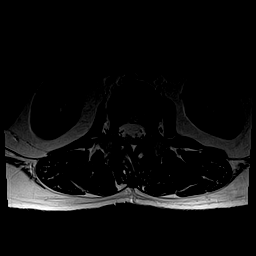
[im 32/37]
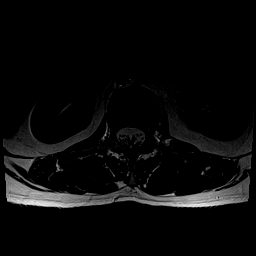
[im 37/37]
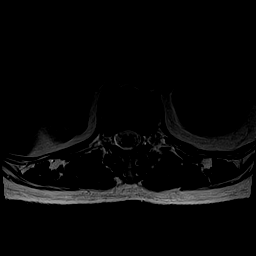

[Series 8: T1 · axial · 4.0mm · 0.74mm/px · z∈[-66,+134]mm · 10 of 37 slices shown (2 of 2)]
[im 3/37]
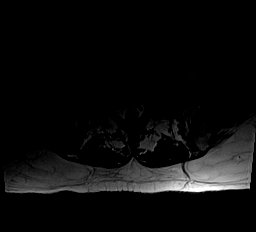
[im 5/37]
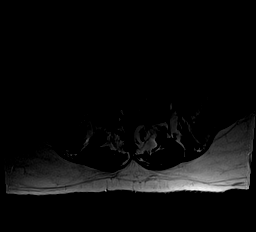
[im 8/37]
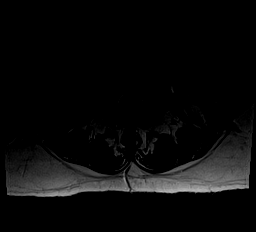
[im 13/37]
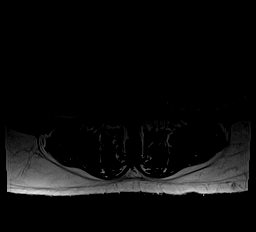
[im 17/37]
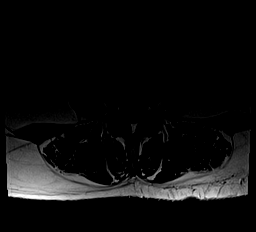
[im 20/37]
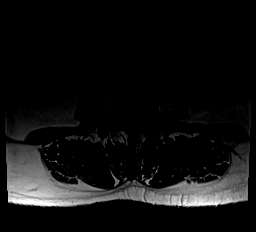
[im 22/37]
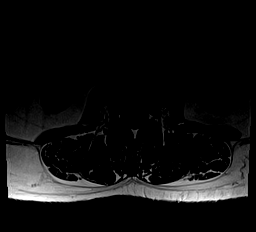
[im 27/37]
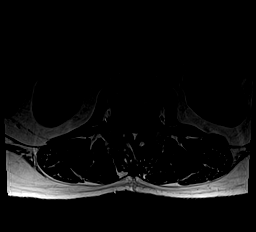
[im 32/37]
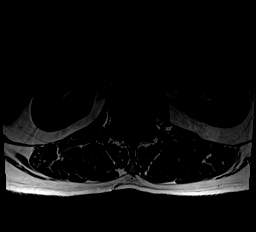
[im 37/37]
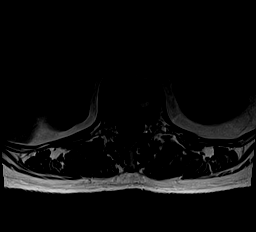

[32 of 48 positions shown; findings below may reference images not displayed]

FINDINGS: Segmentation: Normal segmentation. Lowest well-formed disc labeled
the L5-S1 level.

Alignment: Trace levoscoliosis. Trace anterolisthesis of L4 on L5.
Vertebral bodies otherwise normally aligned made preservation of the
normal lumbar lordosis.

Vertebrae: Vertebral body heights maintained. No acute or chronic
fracture. Bone marrow signal intensity within normal limits. No
discrete or worrisome osseous lesions.

Conus medullaris: Extends to the L1 level and appears normal.

Paraspinal and other soft tissues: Paraspinous soft tissues are
within normal limits. Visualized visceral structures are normal.

Disc levels:

L1-2:  Unremarkable.

L2-3:  Unremarkable.

L3-4: Disc desiccation without significant disc bulge. No canal or
neural foraminal stenosis.

L4-5: Trace anterolisthesis. Mild disc bulge with disc desiccation.
Tiny 2 mm right subarticular disc protrusion mildly indents the
right ventral thecal sac (series 7, image 26). Mild facet ligamentum
flavum hypertrophy. Secondary mild right lateral recess stenosis
without frank neural impingement. Foramina remain widely patent.

L5-S1: Diffuse disc bulge with disc desiccation and intervertebral
disc space narrowing. Broad right subarticular disc protrusion with
associated slight caudad angulation. Protruding disc contacts and
displaces the descending right S1 nerve root in the right lateral
recess (series 7, image 33). No canal stenosis. Additional shallow
left foraminal/extraforaminal disc protrusion, closely approximating
the exiting left L5 nerve root (series 7, image 33). Mild bilateral
L5 foraminal stenosis.
IMPRESSION: 1. Right subarticular disc protrusion at L5-S1 contacting and
displacing the descending right S1 nerve root in the right lateral
recess.
2. Tiny right subarticular disc protrusion at L4-5 with associated
mild right lateral recess stenosis without frank neural impingement.
3. Broad left foraminal/extraforaminal disc protrusion at L5-S1,
closely approximating the exiting left L5 nerve root without overt
impingement.
